# Patient Record
Sex: Female | Born: 1995 | Race: White | Hispanic: No | Marital: Single | Smoking: Current every day smoker
Health system: Southern US, Community
[De-identification: ages and names within clinical notes are randomized; demographics above are authoritative.]

## PROBLEM LIST (undated history)

## (undated) DIAGNOSIS — Z87442 Personal history of urinary calculi: Secondary | ICD-10-CM

## (undated) DIAGNOSIS — K589 Irritable bowel syndrome without diarrhea: Secondary | ICD-10-CM

## (undated) DIAGNOSIS — F329 Major depressive disorder, single episode, unspecified: Secondary | ICD-10-CM

## (undated) DIAGNOSIS — F32A Depression, unspecified: Secondary | ICD-10-CM

## (undated) DIAGNOSIS — D649 Anemia, unspecified: Secondary | ICD-10-CM

## (undated) DIAGNOSIS — N200 Calculus of kidney: Secondary | ICD-10-CM

## (undated) DIAGNOSIS — G43909 Migraine, unspecified, not intractable, without status migrainosus: Secondary | ICD-10-CM

## (undated) DIAGNOSIS — K219 Gastro-esophageal reflux disease without esophagitis: Secondary | ICD-10-CM

## (undated) HISTORY — PX: TONSILLECTOMY: SUR1361

## (undated) HISTORY — PX: WISDOM TOOTH EXTRACTION: SHX21

## (undated) HISTORY — DX: Depression, unspecified: F32.A

## (undated) HISTORY — DX: Gastro-esophageal reflux disease without esophagitis: K21.9

## (undated) HISTORY — DX: Irritable bowel syndrome, unspecified: K58.9

## (undated) HISTORY — DX: Major depressive disorder, single episode, unspecified: F32.9

---

## 2017-03-13 DIAGNOSIS — F329 Major depressive disorder, single episode, unspecified: Secondary | ICD-10-CM | POA: Insufficient documentation

## 2017-03-13 DIAGNOSIS — F419 Anxiety disorder, unspecified: Secondary | ICD-10-CM

## 2017-03-13 DIAGNOSIS — F32A Depression, unspecified: Secondary | ICD-10-CM | POA: Insufficient documentation

## 2017-03-13 DIAGNOSIS — D6851 Activated protein C resistance: Secondary | ICD-10-CM | POA: Insufficient documentation

## 2017-03-13 DIAGNOSIS — G479 Sleep disorder, unspecified: Secondary | ICD-10-CM | POA: Insufficient documentation

## 2017-03-13 DIAGNOSIS — G43009 Migraine without aura, not intractable, without status migrainosus: Secondary | ICD-10-CM | POA: Insufficient documentation

## 2017-07-13 ENCOUNTER — Other Ambulatory Visit: Payer: Self-pay

## 2017-07-13 DIAGNOSIS — R1011 Right upper quadrant pain: Secondary | ICD-10-CM | POA: Diagnosis present

## 2017-07-13 DIAGNOSIS — K8050 Calculus of bile duct without cholangitis or cholecystitis without obstruction: Secondary | ICD-10-CM | POA: Insufficient documentation

## 2017-07-13 LAB — COMPREHENSIVE METABOLIC PANEL
ALT: 19 U/L (ref 14–54)
AST: 26 U/L (ref 15–41)
Albumin: 4.1 g/dL (ref 3.5–5.0)
Alkaline Phosphatase: 142 U/L — ABNORMAL HIGH (ref 38–126)
Anion gap: 10 (ref 5–15)
BILIRUBIN TOTAL: 0.3 mg/dL (ref 0.3–1.2)
BUN: 9 mg/dL (ref 6–20)
CHLORIDE: 103 mmol/L (ref 101–111)
CO2: 25 mmol/L (ref 22–32)
CREATININE: 0.82 mg/dL (ref 0.44–1.00)
Calcium: 9.2 mg/dL (ref 8.9–10.3)
GFR calc non Af Amer: >60 mL/min (ref >60)
Glucose, Bld: 89 mg/dL (ref 65–99)
POTASSIUM: 3.5 mmol/L (ref 3.5–5.1)
Sodium: 138 mmol/L (ref 135–145)
TOTAL PROTEIN: 7.9 g/dL (ref 6.5–8.1)

## 2017-07-13 LAB — LIPASE, BLOOD: LIPASE: 36 U/L (ref 11–51)

## 2017-07-13 LAB — URINALYSIS, COMPLETE (UACMP) WITH MICROSCOPIC
Bilirubin Urine: NEGATIVE
GLUCOSE, UA: NEGATIVE mg/dL
KETONES UR: NEGATIVE mg/dL
NITRITE: NEGATIVE
PH: 5 (ref 5.0–8.0)
Protein, ur: NEGATIVE mg/dL
SPECIFIC GRAVITY, URINE: 1.018 (ref 1.005–1.030)

## 2017-07-13 LAB — CBC
HCT: 38.4 % (ref 35.0–47.0)
Hemoglobin: 12.5 g/dL (ref 12.0–16.0)
MCH: 26 pg (ref 26.0–34.0)
MCHC: 32.7 g/dL (ref 32.0–36.0)
MCV: 79.7 fL — ABNORMAL LOW (ref 80.0–100.0)
PLATELETS: 242 10*3/uL (ref 150–440)
RBC: 4.82 MIL/uL (ref 3.80–5.20)
RDW: 14.5 % (ref 11.5–14.5)
WBC: 8 10*3/uL (ref 3.6–11.0)

## 2017-07-13 LAB — POCT PREGNANCY, URINE: Preg Test, Ur: NEGATIVE

## 2017-07-13 NOTE — ED Triage Notes (Signed)
Patient with "knot in the middle upper quadrants/epigastric area that has been present x 2 days. Patient with nausea and vomiting x2 tonight at work. Patient denies possibility of pregnancy, denies abdominal surgeries in the past.

## 2017-07-14 ENCOUNTER — Emergency Department: Payer: BLUE CROSS/BLUE SHIELD

## 2017-07-14 ENCOUNTER — Emergency Department
Admission: EM | Admit: 2017-07-14 | Discharge: 2017-07-14 | Disposition: A | Discharge status: Home / Self Care | Payer: BLUE CROSS/BLUE SHIELD | Attending: Emergency Medicine | Admitting: Emergency Medicine

## 2017-07-14 ENCOUNTER — Encounter: Payer: Self-pay | Admitting: Emergency Medicine

## 2017-07-14 DIAGNOSIS — K8050 Calculus of bile duct without cholangitis or cholecystitis without obstruction: Secondary | ICD-10-CM

## 2017-07-14 DIAGNOSIS — R1011 Right upper quadrant pain: Secondary | ICD-10-CM

## 2017-07-14 DIAGNOSIS — R112 Nausea with vomiting, unspecified: Secondary | ICD-10-CM

## 2017-07-14 HISTORY — DX: Calculus of kidney: N20.0

## 2017-07-14 HISTORY — DX: Migraine, unspecified, not intractable, without status migrainosus: G43.909

## 2017-07-14 MED ORDER — TRAMADOL HCL 50 MG PO TABS: ORAL_TABLET | ORAL | 0 refills | Status: DC

## 2017-07-14 MED ORDER — DICYCLOMINE HCL 10 MG PO CAPS: 10.0000 mg | ORAL_CAPSULE | Freq: Three times a day (TID) | ORAL | 0 refills | Status: DC | PRN

## 2017-07-14 MED ORDER — DICYCLOMINE HCL 10 MG/ML IM SOLN
20.0000 mg | Freq: Once | INTRAMUSCULAR | Status: AC
  Administered 2017-07-14: 20 mg via INTRAMUSCULAR
  Filled 2017-07-14: qty 2

## 2017-07-14 MED ORDER — ACETAMINOPHEN 500 MG PO TABS
1000.0000 mg | ORAL_TABLET | Freq: Once | ORAL | Status: AC
  Administered 2017-07-14: 1000 mg via ORAL
  Filled 2017-07-14: qty 2

## 2017-07-14 MED ORDER — ONDANSETRON 4 MG PO TBDP: ORAL_TABLET | ORAL | 0 refills | Status: DC

## 2017-07-14 NOTE — ED Notes (Signed)
Pt updated on ultrasound process and result turn around time. Pt verbalizes understanding.

## 2017-07-14 NOTE — ED Notes (Signed)
Report to megan, rn.  

## 2017-07-14 NOTE — ED Notes (Signed)
Pt lying in bed in no acute distress, registration at bedside.

## 2017-07-14 NOTE — Discharge Instructions (Signed)
You have been seen in the Emergency Department (ED) for abdominal pain.  Your evaluation suggests that your pain is caused by gallstones.  Fortunately you do not need immediate surgery at this time, but it is important that you follow up with a surgeon as an outpatient; typically surgical removal of the gallbladder is the only thing that will definitively fix your issue.  Read through the included information about a bland diet, and use any prescribed medications as instructed.  Avoid smoking and alcohol use. ° °Please follow up as instructed above regarding today?s emergent visit and the symptoms that are bothering you. ° °Take Tramadol as prescribed. Do not drink alcohol, drive or participate in any other potentially dangerous activities while taking this medication as it may make you sleepy. Do not take this medication with any other sedating medications, either prescription or over-the-counter. If you were prescribed Percocet or Vicodin, do not take these with acetaminophen (Tylenol) as it is already contained within these medications. °  °This medication is an opiate (or narcotic) pain medication and can be habit forming.  Use it as little as possible to achieve adequate pain control.  Do not use or use it with extreme caution if you have a history of opiate abuse or dependence.  If you are on a pain contract with your primary care doctor or a pain specialist, be sure to let them know you were prescribed this medication today from the [facility].  This medication is intended for your use only - do not give any to anyone else and keep it in a secure place where nobody else, especially children, have access to it.  It will also cause or worsen constipation, so you may want to consider taking an over-the-counter stool softener while you are taking this medication. ° °Return to the ED if your abdominal pain worsens or fails to improve, you develop bloody vomiting, bloody diarrhea, you  are unable to tolerate fluids due to vomiting, fever greater than 101, or other symptoms that concern you. ° °

## 2017-07-14 NOTE — ED Provider Notes (Signed)
[facility] Emergency Department Provider Note  ____________________________________________   First MD Initiated Contact with Patient 07/14/17 0400     (approximate)  I have reviewed the triage vital signs and the nursing notes.   HISTORY  Chief Complaint Abdominal Pain    HPI Brittney Boyle is a 22 y.o. female with chronic medical history as listed below who presents for evaluation of 2 to 3 days of persistent upper abdominal pain.  She says it is worse at night and after she eats.  It is accompanied with multiple episodes of nausea and vomiting.  The pain never completely goes away since it started acutely about 3 days ago.  Nothing particular makes it better.  She describes it as a dull aching pain is at the very bottom of her chest or the very top of her abdomen as well as in the right upper quadrant.  She has no lower abdominal pain.  Her last menstrual cycle was about 2 weeks ago and she has had no recent vaginal bleeding and no dysuria.  She denies fever/chills, chest pain, shortness of breath, and diarrhea.  She states that multiple members of her family have history of gallbladder disease.  Sometimes she has acid reflux.  Past Medical History:  Diagnosis Date  . Kidney stone   . Migraines     There are no active problems to display for this patient.   Past Surgical History:  Procedure Laterality Date  . TONSILLECTOMY      Prior to Admission medications   Medication Sig Start Date End Date Taking? Authorizing Provider  dicyclomine (BENTYL) 10 MG capsule Take 1 capsule (10 mg total) by mouth 3 (three) times daily as needed for up to 14 days for spasms. or abdominal pain 07/14/17 07/28/17  Loleta Rose, MD  ondansetron (ZOFRAN ODT) 4 MG disintegrating tablet Allow 1-2 tablets to dissolve in your mouth every 8 hours as needed for nausea/vomiting 07/14/17   Loleta Rose, MD  traMADol Janean Sark) 50 MG tablet Take 1-2 tablets by mouth every 6 hours as  needed for moderate to severe pain 07/14/17   Loleta Rose, MD    Allergies Sulfa antibiotics  History reviewed. No pertinent family history.  Social History Social History   Tobacco Use  . Smoking status: Never Smoker  . Smokeless tobacco: Never Used  Substance Use Topics  . Alcohol use: Never    Frequency: Never  . Drug use: Never    Review of Systems Constitutional: No fever/chills Eyes: No visual changes. ENT: No sore throat. Cardiovascular: Denies chest pain. Respiratory: Denies shortness of breath. Gastrointestinal: Abdominal pain with nausea and vomiting as described above Genitourinary: Negative for dysuria. Musculoskeletal: Negative for neck pain.  Negative for back pain. Integumentary: Negative for rash. Neurological: Negative for headaches, focal weakness or numbness.   ____________________________________________   PHYSICAL EXAM:  VITAL SIGNS: ED Triage Vitals  Enc Vitals Group     BP 07/13/17 2314 124/82     Pulse Rate 07/13/17 2314 97     Resp 07/13/17 2314 18     Temp 07/13/17 2314 98.6 F (37 C)     Temp Source 07/13/17 2314 Oral     SpO2 --      Weight 07/13/17 2315 90.7 kg (200 lb)     Height 07/13/17 2315 1.626 m ( )     Head Circumference --      Peak Flow --      Pain Score 07/13/17 2315 8  Pain Loc --      Pain Edu? --      Excl. in GC? --     Constitutional: Alert and oriented. Well appearing and in no acute distress. Eyes: Conjunctivae are normal.  Head: Atraumatic. Nose: No congestion/rhinnorhea. Mouth/Throat: Mucous membranes are moist. Neck: No stridor.  No meningeal signs.   Cardiovascular: Normal rate, regular rhythm. Good peripheral circulation. Grossly normal heart sounds. Respiratory: Normal respiratory effort.  No retractions. Lungs CTAB. Gastrointestinal: Obese.  Soft with moderate to severe tenderness to palpation of the epigastrium and right upper quadrant with positive Murphy sign.  No lower abdominal  tenderness to palpation GU: Deferred given that she is having upper abdominal pain and no lower abdominal pain or pelvic pain Musculoskeletal: No lower extremity tenderness nor edema. No gross deformities of extremities. Neurologic:  Normal speech and language. No gross focal neurologic deficits are appreciated.  Skin:  Skin is warm, dry and intact. No rash noted. Psychiatric: Mood and affect are normal. Speech and behavior are normal.  ____________________________________________   LABS (all labs ordered are listed, but only abnormal results are displayed)  Labs Reviewed  COMPREHENSIVE METABOLIC PANEL - Abnormal; Notable for the following components:      Result Value   Alkaline Phosphatase 142 (*)    All other components within normal limits  CBC - Abnormal; Notable for the following components:   MCV 79.7 (*)    All other components within normal limits  URINALYSIS, COMPLETE (UACMP) WITH MICROSCOPIC - Abnormal; Notable for the following components:   Color, Urine YELLOW (*)    APPearance HAZY (*)    Hgb urine dipstick MODERATE (*)    Leukocytes, UA SMALL (*)    RBC / HPF >50 (*)    Bacteria, UA RARE (*)    All other components within normal limits  LIPASE, BLOOD  POC URINE PREG, ED  POCT PREGNANCY, URINE   ____________________________________________  EKG  None - EKG not ordered by ED physician ____________________________________________  RADIOLOGY   ED MD interpretation:  single mobile gallstone without evidence of cholecystitis  Official radiology report(s): US Abdomen Limited Ruq  Result Date: 07/14/2017 CLINICAL DATA:  Epigastric pain with nausea and vomiting EXAM: ULTRASOUND ABDOMEN LIMITED RIGHT UPPER QUADRANT COMPARISON:  None. FINDINGS: Gallbladder: There is a single mobile shadowing stone measuring 5.5 mm. No wall thickening or pericholecystic fluid. Negative sonographic Eulah Pont sign reported by the sonographer. Common bile duct: Diameter: 2 mm Liver: No  focal lesion identified. Within normal limits in parenchymal echogenicity. Portal vein is patent on color Doppler imaging with normal direction of blood flow towards the liver. IMPRESSION: Single mobile gallstone without other evidence of acute cholecystitis. Electronically Signed   By: Deatra Robinson M.D.   On: 07/14/2017 05:37    ____________________________________________   PROCEDURES  Critical Care performed: No   Procedure(s) performed:   Procedures   ____________________________________________   INITIAL IMPRESSION / ASSESSMENT AND PLAN / ED COURSE  As part of my medical decision making, I reviewed the following data within the electronic MEDICAL RECORD NUMBER Nursing notes reviewed and incorporated and Labs reviewed     Differential diagnosis includes, but is not limited to, biliary disease (biliary colic, acute cholecystitis, cholangitis, choledocholithiasis, etc), intrathoracic causes for epigastric abdominal pain including ACS, gastritis, duodenitis, pancreatitis, small bowel or large bowel obstruction, abdominal aortic aneurysm, hernia, and ulcer(s).  Based on the patient's demographics and history of present illness, I think most likely she has gallbladder disease although acid reflux is  the next item on my differential.  Lab work is notable only for an elevated alkaline phosphatase.  Lipase is within normal limits and there is no AST or ALT elevation.  She does have some blood in her urine with greater than 50 reds and 11-20 white cells but it is nitrite negative and she is having no dysuria.  I will send a culture but not treat empirically.  Obtaining ultrasound.  Patient agrees with plan.   Clinical Course as of Jul 14 712  Sat Jul 14, 2017  1610 Radiologist identified one mobile stone in the gallbladder but no signs of cholecystitis.  I will discuss with the patient and give my usual customary outpatient management recommendations.  US ABDOMEN LIMITED RUQ [CF]  [phone]  Patient is still having mild to moderate discomfort at this time so I will treat with Bentyl 20 mg intramuscular.  She does not want to have anything that will make her sleepy.  I am providing a prescription for Bentyl, tramadol, and Zofran.  I gave my usual and customary return precautions and she understands and agrees with the plan for outpatient follow-up.  I also gave her the gallbladder eating plan.   [CF]    Clinical Course User Index [CF] Loleta Rose, MD    ____________________________________________  FINAL CLINICAL IMPRESSION(S) / ED DIAGNOSES  Final diagnoses:  RUQ pain  Non-intractable vomiting with nausea, unspecified vomiting type  Biliary colic     MEDICATIONS GIVEN DURING THIS VISIT:  Medications  dicyclomine (BENTYL) injection 20 mg (has no administration in time range)  acetaminophen (TYLENOL) tablet 1,000 mg (1,000 mg Oral Given 07/14/17 0636)     ED Discharge Orders        Ordered    dicyclomine (BENTYL) 10 MG capsule  3 times daily PRN     07/14/17 0636    traMADol (ULTRAM) 50 MG tablet     07/14/17 0636    ondansetron (ZOFRAN ODT) 4 MG disintegrating tablet     07/14/17 0636       Note:  This document was prepared using Dragon voice recognition software and may include unintentional dictation errors.    Loleta Rose, MD 07/14/17 [id number]

## 2017-07-14 NOTE — ED Notes (Signed)
Patient transported to Ultrasound 

## 2017-07-14 NOTE — ED Notes (Signed)
NAD noted at time of D/C. Pt denies questions or concerns. Pt ambulatory to the lobby at this time.  

## 2017-07-30 ENCOUNTER — Other Ambulatory Visit: Payer: Self-pay

## 2017-07-31 ENCOUNTER — Ambulatory Visit (INDEPENDENT_AMBULATORY_CARE_PROVIDER_SITE_OTHER): Payer: BLUE CROSS/BLUE SHIELD | Admitting: Surgery

## 2017-07-31 ENCOUNTER — Encounter: Payer: Self-pay | Admitting: Surgery

## 2017-07-31 VITALS — BP 121/71 | HR 79 | Temp 98.1°F | Ht 64.0 in | Wt 206.0 lb

## 2017-07-31 DIAGNOSIS — K8020 Calculus of gallbladder without cholecystitis without obstruction: Secondary | ICD-10-CM | POA: Diagnosis not present

## 2017-07-31 NOTE — H&P (View-Only) (Signed)
Surgical Clinic History and Physical  Referring provider:  [facility] [address] [place], [place] [address]  HISTORY OF PRESENT ILLNESS (HPI):  22 y.o. female presents for evaluation of abdominal pain. Patient reports she's previously experienced more mild post-prandial RUQ > epigastric abdominal pain after eating fatty foods such as meats, cheese, dairy, and fried foods in particular x several years. Two weeks ago, she experienced her first episode of severe RUQ abdominal pain for which she presented to [facility] ED, since which she has tried to avoid most fatty foods. She also describes occasional nausea without emesis, continues to pass flatus and BM WNL without abdominal distention and likewise denies fever/chills, CP, or SOB. She presents today, requesting cholecystectomy to avoid experiencing recurrence of her pain and fear of eating "the wrong foods". She also adds most people in her family have underwent cholecystectomy for similar.  PAST MEDICAL HISTORY (PMH):  Past Medical History:  Diagnosis Date  . Depression   . GERD (gastroesophageal reflux disease)   . IBS (irritable bowel syndrome)   . Kidney stone   . Migraines      PAST SURGICAL HISTORY (PSH):  Past Surgical History:  Procedure Laterality Date  . TONSILLECTOMY    . WISDOM TOOTH EXTRACTION       MEDICATIONS:  Prior to Admission medications   Medication Sig Start Date End Date Taking? Authorizing Provider  Multiple Vitamin (MULTI-VITAMINS) TABS Take by mouth.   Yes [provider]  Norgestimate-Ethinyl Estradiol Triphasic 0.18/0.215/0.25 MG-35 MCG tablet Take by mouth. 03/13/17 03/13/18 Yes [provider]  omeprazole (PRILOSEC) 20 MG capsule Take 20 mg by mouth daily.   Yes [provider]  ondansetron (ZOFRAN ODT) 4 MG disintegrating tablet Allow 1-2 tablets to dissolve in your mouth every 8 hours as needed for nausea/vomiting 07/14/17  Yes Loleta Rose, MD  Probiotic Product  (PROBIOTIC-10) CAPS Take by mouth.   Yes [provider]  tamsulosin (FLOMAX) 0.4 MG CAPS capsule Take by mouth. 03/13/17  Yes [provider]  traMADol (ULTRAM) 50 MG tablet Take 1-2 tablets by mouth every 6 hours as needed for moderate to severe pain 07/14/17  Yes Loleta Rose, MD  venlafaxine XR (EFFEXOR-XR) 150 MG 24 hr capsule Take by mouth. 03/14/17 03/14/18 Yes [provider]  venlafaxine XR (EFFEXOR-XR) 75 MG 24 hr capsule Take by mouth. 11/17/16  Yes [provider]  dicyclomine (BENTYL) 10 MG capsule Take 1 capsule (10 mg total) by mouth 3 (three) times daily as needed for up to 14 days for spasms. or abdominal pain 07/14/17 07/28/17  Loleta Rose, MD     ALLERGIES:  Allergies  Allergen Reactions  . Sulfa Antibiotics Anaphylaxis and Swelling  . Shellfish Allergy Rash     SOCIAL HISTORY:  Social History   Socioeconomic History  . Marital status: Single    Spouse name: Not on file  . Number of children: Not on file  . Years of education: Not on file  . Highest education level: Not on file  Occupational History  . Not on file  Social Needs  . Financial resource strain: Not on file  . Food insecurity:    Worry: Not on file    Inability: Not on file  . Transportation needs:    Medical: Not on file    Non-medical: Not on file  Tobacco Use  . Smoking status: Never Smoker  . Smokeless tobacco: Never Used  Substance and Sexual Activity  . Alcohol use: Never  Frequency: Never  . Drug use: Never  . Sexual activity: Not Currently  Lifestyle  . Physical activity:    Days per week: Not on file    Minutes per session: Not on file  . Stress: Not on file  Relationships  . Social connections:    Talks on phone: Not on file    Gets together: Not on file    Attends religious service: Not on file    Active member of club or organization: Not on file    Attends meetings of clubs or organizations: Not on file    Relationship status: Not on  file  . Intimate partner violence:    Fear of current or ex partner: Not on file    Emotionally abused: Not on file    Physically abused: Not on file    Forced sexual activity: Not on file  Other Topics Concern  . Not on file  Social History Narrative  . Not on file    The patient currently resides (home / rehab facility / nursing home): Home The patient normally is (ambulatory / bedbound): Ambulatory  FAMILY HISTORY:  Family History  Problem Relation Age of Onset  . Endometrial cancer Maternal Grandmother   . Thyroid cancer Paternal Grandmother   . Breast cancer Other     Otherwise negative/non-contributory.  REVIEW OF SYSTEMS:  Constitutional: denies any other weight loss, fever, chills, or sweats  Eyes: denies any other vision changes, history of eye injury  ENT: denies sore throat, hearing problems  Respiratory: denies shortness of breath, wheezing  Cardiovascular: denies chest pain, palpitations  Gastrointestinal: abdominal pain, N/V, and bowel function as per HPI Musculoskeletal: denies any other joint pains or cramps  Skin: Denies any other rashes or skin discolorations Neurological: denies any other headache, dizziness, weakness  Psychiatric: Denies any other depression, anxiety   All other review of systems were otherwise negative   VITAL SIGNS:  BP 121/71   Pulse 79   Temp 98.1 F (36.7 C) (Oral)   Ht 5\' 4"  (1.626 m)   Wt 206 lb (93.4 kg)   BMI 35.36 kg/m   PHYSICAL EXAM:  Constitutional:  -- Obese body habitus  -- Awake, alert, and oriented x3  Eyes:  -- Pupils equally round and reactive to light  -- No scleral icterus  Ear, nose, throat:  -- No jugular venous distension -- No nasal drainage, bleeding Pulmonary:  -- No crackles  -- Equal breath sounds bilaterally -- Breathing non-labored at rest Cardiovascular:  -- S1, S2 present  -- No pericardial rubs  Gastrointestinal:  -- Abdomen soft and non-distended with mild-/moderate- RUQ abdominal  pain, no guarding/rebound  -- No abdominal masses appreciated, pulsatile or otherwise  Musculoskeletal and Integumentary:  -- Wounds or skin discoloration: None appreciated -- Extremities: B/L UE and LE FROM, hands and feet warm  Neurologic:  -- Motor function: Intact and symmetric -- Sensation: Intact and symmetric  Labs:  CBC Latest Ref Rng & Units 07/13/2017  WBC 3.6 - 11.0 K/uL 8.0  Hemoglobin 12.0 - 16.0 g/dL 16.112.5  Hematocrit 09.635.0 - 47.0 % 38.4  Platelets 150 - 440 K/uL 242   CMP Latest Ref Rng & Units 07/13/2017  Glucose 65 - 99 mg/dL 89  BUN 6 - 20 mg/dL 9  Creatinine 0.450.44 - 4.091.00 mg/dL 8.110.82  Sodium 914135 - 782145 mmol/L 138  Potassium 3.5 - 5.1 mmol/L 3.5  Chloride 101 - 111 mmol/L 103  CO2 22 - 32 mmol/L 25  Calcium  8.9 - 10.3 mg/dL 9.2  Total Protein 6.5 - 8.1 g/dL 7.9  Total Bilirubin 0.3 - 1.2 mg/dL 0.3  Alkaline Phos 38 - 126 U/L 142(H)  AST 15 - 41 U/L 26  ALT 14 - 54 U/L 19    Imaging studies:  Limited RUQ Abdominal Ultrasound (07/14/2017) Cholelithiasis without sonographic evidence of cholecystitis. Common bile duct diameter measures 2 mm.   Assessment/Plan: (ICD-10's: K78.20) 22 y.o. female with symptomatic cholelithiasis, complicated by pertinent comorbidities including morbid obesity (BMI >35), IBS, GERD, nephrolithiasis, and migraine headaches.              - avoid/minimize foods with higher fat content (meats, cheeses/dairy, and fried)             - prefer low-fat vegetables, whole grains (wheat bread, ceareals, etc), and fruits until cholecystectomy              - all risks, benefits, and alternatives to cholecystectomy were discussed with the patient, all of his questions were answered to his expressed satisfaction, patient expresses she wishes to proceed, and informed consent was obtained.             - will plan for laparoscopic cholecystectomy pending OR availability             - anticipate return to clinic 2 weeks after above planned surgery              - instructed to call if any questions or concerns  All of the above recommendations were discussed with the patient and patient's family, and all of patient's and family's questions were answered to their expressed satisfaction.  Thank you for the opportunity to participate in this patient's care.  -- Scherrie Gerlach Earlene Plater, MD, RPVI [place]: [facility] - Partnering for exceptional care. Office: [phone]

## 2017-07-31 NOTE — Patient Instructions (Signed)
You have requested to have your Gallbladder removed. We will arrange this to be done on 08/15/2017 at [facility].   You will be off from work for approximately 1-2 weeks depending on your recovery.   Please avoid greasy and fried foods if at all possible prior to your scheduled surgery to decrease symptoms until then.  Please see the [facility] pre-care form you have been given today.  If you have any questions or concerns please call our office.

## 2017-07-31 NOTE — Progress Notes (Signed)
Surgical Clinic History and Physical  Referring provider:  Care, [facility] [address] [place], [place] [address]  HISTORY OF PRESENT ILLNESS (HPI):  22 y.o. female presents for evaluation of abdominal pain. Patient reports she's previously experienced more mild post-prandial RUQ > epigastric abdominal pain after eating fatty foods such as meats, cheese, dairy, and fried foods in particular x several years. Two weeks ago, she experienced her first episode of severe RUQ abdominal pain for which she presented to [facility] ED, since which she has tried to avoid most fatty foods. She also describes occasional nausea without emesis, continues to pass flatus and BM WNL without abdominal distention and likewise denies fever/chills, CP, or SOB. She presents today, requesting cholecystectomy to avoid experiencing recurrence of her pain and fear of eating "the wrong foods". She also adds most people in her family have underwent cholecystectomy for similar.  PAST MEDICAL HISTORY (PMH):  Past Medical History:  Diagnosis Date  . Depression   . GERD (gastroesophageal reflux disease)   . IBS (irritable bowel syndrome)   . Kidney stone   . Migraines      PAST SURGICAL HISTORY (PSH):  Past Surgical History:  Procedure Laterality Date  . TONSILLECTOMY    . WISDOM TOOTH EXTRACTION       MEDICATIONS:  Prior to Admission medications   Medication Sig Start Date End Date Taking? Authorizing Provider  Multiple Vitamin (MULTI-VITAMINS) TABS Take by mouth.   Yes [provider]  Norgestimate-Ethinyl Estradiol Triphasic 0.18/0.215/0.25 MG-35 MCG tablet Take by mouth. 03/13/17 03/13/18 Yes [provider]  omeprazole (PRILOSEC) 20 MG capsule Take 20 mg by mouth daily.   Yes [provider]  ondansetron (ZOFRAN ODT) 4 MG disintegrating tablet Allow 1-2 tablets to dissolve in your mouth every 8 hours as needed for nausea/vomiting 07/14/17  Yes Loleta Rose, MD  Probiotic Product  (PROBIOTIC-10) CAPS Take by mouth.   Yes [provider]  tamsulosin (FLOMAX) 0.4 MG CAPS capsule Take by mouth. 03/13/17  Yes [provider]  traMADol (ULTRAM) 50 MG tablet Take 1-2 tablets by mouth every 6 hours as needed for moderate to severe pain 07/14/17  Yes Loleta Rose, MD  venlafaxine XR (EFFEXOR-XR) 150 MG 24 hr capsule Take by mouth. 03/14/17 03/14/18 Yes [provider]  venlafaxine XR (EFFEXOR-XR) 75 MG 24 hr capsule Take by mouth. 11/17/16  Yes [provider]  dicyclomine (BENTYL) 10 MG capsule Take 1 capsule (10 mg total) by mouth 3 (three) times daily as needed for up to 14 days for spasms. or abdominal pain 07/14/17 07/28/17  Loleta Rose, MD     ALLERGIES:  Allergies  Allergen Reactions  . Sulfa Antibiotics Anaphylaxis and Swelling  . Shellfish Allergy Rash     SOCIAL HISTORY:  Social History   Socioeconomic History  . Marital status: Single    Spouse name: Not on file  . Number of children: Not on file  . Years of education: Not on file  . Highest education level: Not on file  Occupational History  . Not on file  Social Needs  . Financial resource strain: Not on file  . Food insecurity:    Worry: Not on file    Inability: Not on file  . Transportation needs:    Medical: Not on file    Non-medical: Not on file  Tobacco Use  . Smoking status: Never Smoker  . Smokeless tobacco: Never Used  Substance and Sexual Activity  . Alcohol use: Never  Frequency: Never  . Drug use: Never  . Sexual activity: Not Currently  Lifestyle  . Physical activity:    Days per week: Not on file    Minutes per session: Not on file  . Stress: Not on file  Relationships  . Social connections:    Talks on phone: Not on file    Gets together: Not on file    Attends religious service: Not on file    Active member of club or organization: Not on file    Attends meetings of clubs or organizations: Not on file    Relationship status: Not on  file  . Intimate partner violence:    Fear of current or ex partner: Not on file    Emotionally abused: Not on file    Physically abused: Not on file    Forced sexual activity: Not on file  Other Topics Concern  . Not on file  Social History Narrative  . Not on file    The patient currently resides (home / rehab facility / nursing home): Home The patient normally is (ambulatory / bedbound): Ambulatory  FAMILY HISTORY:  Family History  Problem Relation Age of Onset  . Endometrial cancer Maternal Grandmother   . Thyroid cancer Paternal Grandmother   . Breast cancer Other     Otherwise negative/non-contributory.  REVIEW OF SYSTEMS:  Constitutional: denies any other weight loss, fever, chills, or sweats  Eyes: denies any other vision changes, history of eye injury  ENT: denies sore throat, hearing problems  Respiratory: denies shortness of breath, wheezing  Cardiovascular: denies chest pain, palpitations  Gastrointestinal: abdominal pain, N/V, and bowel function as per HPI Musculoskeletal: denies any other joint pains or cramps  Skin: Denies any other rashes or skin discolorations Neurological: denies any other headache, dizziness, weakness  Psychiatric: Denies any other depression, anxiety   All other review of systems were otherwise negative   VITAL SIGNS:  BP 121/71   Pulse 79   Temp 98.1 F (36.7 C) (Oral)   Ht 5\' 4"  (1.626 m)   Wt 206 lb (93.4 kg)   BMI 35.36 kg/m   PHYSICAL EXAM:  Constitutional:  -- Obese body habitus  -- Awake, alert, and oriented x3  Eyes:  -- Pupils equally round and reactive to light  -- No scleral icterus  Ear, nose, throat:  -- No jugular venous distension -- No nasal drainage, bleeding Pulmonary:  -- No crackles  -- Equal breath sounds bilaterally -- Breathing non-labored at rest Cardiovascular:  -- S1, S2 present  -- No pericardial rubs  Gastrointestinal:  -- Abdomen soft and non-distended with mild-/moderate- RUQ abdominal  pain, no guarding/rebound  -- No abdominal masses appreciated, pulsatile or otherwise  Musculoskeletal and Integumentary:  -- Wounds or skin discoloration: None appreciated -- Extremities: B/L UE and LE FROM, hands and feet warm  Neurologic:  -- Motor function: Intact and symmetric -- Sensation: Intact and symmetric  Labs:  CBC Latest Ref Rng & Units 07/13/2017  WBC 3.6 - 11.0 K/uL 8.0  Hemoglobin 12.0 - 16.0 g/dL 16.112.5  Hematocrit 09.635.0 - 47.0 % 38.4  Platelets 150 - 440 K/uL 242   CMP Latest Ref Rng & Units 07/13/2017  Glucose 65 - 99 mg/dL 89  BUN 6 - 20 mg/dL 9  Creatinine 0.450.44 - 4.091.00 mg/dL 8.110.82  Sodium 914135 - 782145 mmol/L 138  Potassium 3.5 - 5.1 mmol/L 3.5  Chloride 101 - 111 mmol/L 103  CO2 22 - 32 mmol/L 25  Calcium  8.9 - 10.3 mg/dL 9.2  Total Protein 6.5 - 8.1 g/dL 7.9  Total Bilirubin 0.3 - 1.2 mg/dL 0.3  Alkaline Phos 38 - 126 U/L 142(H)  AST 15 - 41 U/L 26  ALT 14 - 54 U/L 19    Imaging studies:  Limited RUQ Abdominal Ultrasound (07/14/2017) Cholelithiasis without sonographic evidence of cholecystitis. Common bile duct diameter measures 2 mm.   Assessment/Plan: (ICD-10's: K78.20) 22 y.o. female with symptomatic cholelithiasis, complicated by pertinent comorbidities including morbid obesity (BMI >35), IBS, GERD, nephrolithiasis, and migraine headaches.              - avoid/minimize foods with higher fat content (meats, cheeses/dairy, and fried)             - prefer low-fat vegetables, whole grains (wheat bread, ceareals, etc), and fruits until cholecystectomy              - all risks, benefits, and alternatives to cholecystectomy were discussed with the patient, all of his questions were answered to his expressed satisfaction, patient expresses she wishes to proceed, and informed consent was obtained.             - will plan for laparoscopic cholecystectomy pending OR availability             - anticipate return to clinic 2 weeks after above planned surgery              - instructed to call if any questions or concerns  All of the above recommendations were discussed with the patient and patient's family, and all of patient's and family's questions were answered to their expressed satisfaction.  Thank you for the opportunity to participate in this patient's care.  -- Scherrie Gerlach Earlene Plater, MD, RPVI [place]: [facility] General Surgery - Partnering for exceptional care. Office: [phone]

## 2017-08-01 ENCOUNTER — Encounter: Payer: Self-pay | Admitting: Surgery

## 2017-08-02 ENCOUNTER — Telehealth: Payer: Self-pay | Admitting: Surgery

## 2017-08-02 NOTE — Telephone Encounter (Signed)
Pt advised of pre op date/time and sx date. Sx: 08/15/17 with Dr Davis-laparoscopic cholecystectomy.  Pre op: 08/08/17 between 1-5:00pm--phone interview.   Patient made aware to call [phone], between 1-3:00pm the day before surgery, to find out what time to arrive.

## 2017-08-08 ENCOUNTER — Encounter
Admission: RE | Admit: 2017-08-08 | Discharge: 2017-08-08 | Disposition: A | Discharge status: Home / Self Care | Payer: BLUE CROSS/BLUE SHIELD | Source: Ambulatory Visit | Attending: Surgery | Admitting: Surgery

## 2017-08-08 ENCOUNTER — Other Ambulatory Visit: Payer: Self-pay

## 2017-08-08 HISTORY — DX: Anemia, unspecified: D64.9

## 2017-08-08 HISTORY — DX: Personal history of urinary calculi: Z87.442

## 2017-08-08 NOTE — Patient Instructions (Signed)
Your procedure is scheduled on: 08-15-17 Report to Same Day Surgery 2nd floor medical mall [facility](Medical Mall Entrance-take elevator on left to 2nd floor.  Check in with surgery information desk.) To find out your arrival time please call [phone] between 1PM - 3PM on 08-14-17  Remember: Instructions that are not followed completely may result in serious medical risk, up to and including death, or upon the discretion of your surgeon and anesthesiologist your surgery may need to be rescheduled.    _x___ 1. Do not eat food after midnight the night before your procedure. NO GUM OR CANDY AFTER MIDNIGHT.  You may drink clear liquids up to 2 hours before you are scheduled to arrive at the hospital for your procedure.  Do not drink clear liquids within 2 hours of your scheduled arrival to the hospital.  Clear liquids include  --Water or Apple juice without pulp  --Clear carbohydrate beverage such as ClearFast or Gatorade  --Black Coffee or Clear Tea (No milk, no creamers, do not add anything to  the coffee or Tea     __x__ 2. No Alcohol for 24 hours before or after surgery.   __x__3. No Smoking or e-cigarettes for 24 prior to surgery.  Do not use any chewable tobacco products for at least 6 hour prior to surgery   ____  4. Bring all medications with you on the day of surgery if instructed.    __x__ 5. Notify your doctor if there is any change in your medical condition     (cold, fever, infections).    x___6. On the morning of surgery brush your teeth with toothpaste and water.  You may rinse your mouth with mouth wash if you wish.  Do not swallow any toothpaste or mouthwash.   Do not wear jewelry, make-up, hairpins, clips or nail polish.  Do not wear lotions, powders, or perfumes. You may wear deodorant.  Do not shave 48 hours prior to surgery. Men may shave face and neck.  Do not bring valuables to the hospital.    [facility] any belongings or valuables.     Contacts, dentures or bridgework may not be worn into surgery.  Leave your suitcase in the car. After surgery it may be brought to your room.  For patients admitted to the hospital, discharge time is determined by your treatment team.  _  Patients discharged the day of surgery will not be allowed to drive home.  You will need someone to drive you home and stay with you the night of your procedure.    Please read over the following fact sheets that you were given:   [facility] Preparing for [facility] and or MRSA Information   _x___ TAKE THE FOLLOWING MEDICATION THE MORNING OF SURGERY WITH A SMALL SIP OF WATER. These include:  1. FLOMAX  2. EFFEXOR   3. BCP (PT REQUESTING TO TAKE THIS SINCE SHE TAKES IT AT THE SAME TIME DAILY)  4.  5.  6.  ____Fleets enema or Magnesium Citrate as directed.   ____ Use CHG Soap or sage wipes as directed on instruction sheet   ____ Use inhalers on the day of surgery and bring to hospital day of surgery  ____ Stop Metformin and Janumet 2 days prior to surgery.    ____ Take 1/2 of usual insulin dose the night before surgery and none on the morning surgery.   ____ Follow recommendations from Cardiologist, Pulmonologist or PCP regarding stopping Aspirin, Coumadin, Plavix ,  Eliquis, Effient, or Pradaxa, and Pletal.  X____Stop Anti-inflammatories such as Advil, Aleve, Ibuprofen, Motrin, Naproxen, Naprosyn, Goodies powders, EXCEDRIN MIGRAINE or aspirin products NOW-OK to take Tylenol    _X___ Stop supplements until after surgery-STOP MELATONIN NOW-MAY RESUME AFTER SURGERY   ____ Bring C-Pap to the hospital.

## 2017-08-15 ENCOUNTER — Ambulatory Visit: Payer: BLUE CROSS/BLUE SHIELD | Admitting: Registered Nurse

## 2017-08-15 ENCOUNTER — Ambulatory Visit
Admission: RE | Admit: 2017-08-15 | Discharge: 2017-08-15 | Disposition: A | Discharge status: Home / Self Care | Payer: BLUE CROSS/BLUE SHIELD | Source: Ambulatory Visit | Attending: Surgery | Admitting: Surgery

## 2017-08-15 ENCOUNTER — Encounter
Admission: RE | Disposition: A | Discharge status: Home / Self Care | Payer: Self-pay | Source: Ambulatory Visit | Attending: Surgery

## 2017-08-15 DIAGNOSIS — Z882 Allergy status to sulfonamides status: Secondary | ICD-10-CM | POA: Diagnosis not present

## 2017-08-15 DIAGNOSIS — Z91013 Allergy to seafood: Secondary | ICD-10-CM | POA: Diagnosis not present

## 2017-08-15 DIAGNOSIS — K589 Irritable bowel syndrome without diarrhea: Secondary | ICD-10-CM | POA: Diagnosis not present

## 2017-08-15 DIAGNOSIS — F329 Major depressive disorder, single episode, unspecified: Secondary | ICD-10-CM | POA: Insufficient documentation

## 2017-08-15 DIAGNOSIS — K219 Gastro-esophageal reflux disease without esophagitis: Secondary | ICD-10-CM | POA: Diagnosis not present

## 2017-08-15 DIAGNOSIS — K8010 Calculus of gallbladder with chronic cholecystitis without obstruction: Secondary | ICD-10-CM | POA: Insufficient documentation

## 2017-08-15 DIAGNOSIS — K8020 Calculus of gallbladder without cholecystitis without obstruction: Secondary | ICD-10-CM

## 2017-08-15 DIAGNOSIS — Z79899 Other long term (current) drug therapy: Secondary | ICD-10-CM | POA: Diagnosis not present

## 2017-08-15 DIAGNOSIS — F419 Anxiety disorder, unspecified: Secondary | ICD-10-CM | POA: Insufficient documentation

## 2017-08-15 HISTORY — PX: CHOLECYSTECTOMY: SHX55

## 2017-08-15 LAB — POCT PREGNANCY, URINE: Preg Test, Ur: NEGATIVE

## 2017-08-15 SURGERY — LAPAROSCOPIC CHOLECYSTECTOMY
Anesthesia: General

## 2017-08-15 MED ORDER — ONDANSETRON HCL 4 MG/2ML IJ SOLN: 4.0000 mg | Freq: Once | INTRAMUSCULAR | Status: DC | PRN

## 2017-08-15 MED ORDER — CHLORHEXIDINE GLUCONATE CLOTH 2 % EX PADS
6.0000 | MEDICATED_PAD | Freq: Once | CUTANEOUS | Status: AC
  Administered 2017-08-15: 6 via TOPICAL

## 2017-08-15 MED ORDER — SUGAMMADEX SODIUM 200 MG/2ML IV SOLN
INTRAVENOUS | Status: DC | PRN
  Administered 2017-08-15: 200 mg via INTRAVENOUS

## 2017-08-15 MED ORDER — IPRATROPIUM-ALBUTEROL 0.5-2.5 (3) MG/3ML IN SOLN
3.0000 mL | Freq: Once | RESPIRATORY_TRACT | Status: AC
  Administered 2017-08-15: 3 mL via RESPIRATORY_TRACT

## 2017-08-15 MED ORDER — LIDOCAINE HCL 1 % IJ SOLN
INTRAMUSCULAR | Status: DC | PRN
  Administered 2017-08-15: 20 mL

## 2017-08-15 MED ORDER — ONDANSETRON HCL 4 MG/2ML IJ SOLN
INTRAMUSCULAR | Status: AC
  Filled 2017-08-15: qty 2

## 2017-08-15 MED ORDER — GABAPENTIN 300 MG PO CAPS
ORAL_CAPSULE | ORAL | Status: AC
  Filled 2017-08-15: qty 1

## 2017-08-15 MED ORDER — GABAPENTIN 300 MG PO CAPS
300.0000 mg | ORAL_CAPSULE | ORAL | Status: AC
  Administered 2017-08-15: 300 mg via ORAL

## 2017-08-15 MED ORDER — FENTANYL CITRATE (PF) 100 MCG/2ML IJ SOLN
INTRAMUSCULAR | Status: AC
  Administered 2017-08-15: 25 ug via INTRAVENOUS
  Filled 2017-08-15: qty 2

## 2017-08-15 MED ORDER — SUGAMMADEX SODIUM 200 MG/2ML IV SOLN
INTRAVENOUS | Status: AC
  Filled 2017-08-15: qty 2

## 2017-08-15 MED ORDER — MIDAZOLAM HCL 2 MG/2ML IJ SOLN
INTRAMUSCULAR | Status: DC | PRN
  Administered 2017-08-15: 2 mg via INTRAVENOUS

## 2017-08-15 MED ORDER — LACTATED RINGERS IV SOLN
INTRAVENOUS | Status: DC
  Administered 2017-08-15: 11:00:00 via INTRAVENOUS

## 2017-08-15 MED ORDER — FENTANYL CITRATE (PF) 100 MCG/2ML IJ SOLN
INTRAMUSCULAR | Status: DC | PRN
  Administered 2017-08-15 (×2): 50 ug via INTRAVENOUS

## 2017-08-15 MED ORDER — HYDROMORPHONE HCL 1 MG/ML IJ SOLN
INTRAMUSCULAR | Status: AC
  Filled 2017-08-15: qty 1

## 2017-08-15 MED ORDER — ONDANSETRON HCL 4 MG/2ML IJ SOLN
INTRAMUSCULAR | Status: DC | PRN
  Administered 2017-08-15: 4 mg via INTRAVENOUS

## 2017-08-15 MED ORDER — DEXAMETHASONE SODIUM PHOSPHATE 10 MG/ML IJ SOLN
INTRAMUSCULAR | Status: AC
  Filled 2017-08-15: qty 1

## 2017-08-15 MED ORDER — SODIUM CHLORIDE 0.9 % IR SOLN
Status: DC | PRN
  Administered 2017-08-15: 25 mL

## 2017-08-15 MED ORDER — PROPOFOL 10 MG/ML IV BOLUS
INTRAVENOUS | Status: AC
  Filled 2017-08-15: qty 40

## 2017-08-15 MED ORDER — LIDOCAINE HCL (PF) 2 % IJ SOLN
INTRAMUSCULAR | Status: AC
  Filled 2017-08-15: qty 10

## 2017-08-15 MED ORDER — KETOROLAC TROMETHAMINE 30 MG/ML IJ SOLN
INTRAMUSCULAR | Status: DC | PRN
  Administered 2017-08-15: 30 mg via INTRAVENOUS

## 2017-08-15 MED ORDER — IPRATROPIUM-ALBUTEROL 0.5-2.5 (3) MG/3ML IN SOLN
3.0000 mL | RESPIRATORY_TRACT | Status: DC
  Administered 2017-08-15: 3 mL via RESPIRATORY_TRACT

## 2017-08-15 MED ORDER — IPRATROPIUM-ALBUTEROL 0.5-2.5 (3) MG/3ML IN SOLN
RESPIRATORY_TRACT | Status: AC
  Administered 2017-08-15: 3 mL via RESPIRATORY_TRACT
  Filled 2017-08-15: qty 3

## 2017-08-15 MED ORDER — OXYCODONE-ACETAMINOPHEN 5-325 MG PO TABS
1.0000 | ORAL_TABLET | ORAL | Status: DC | PRN
  Administered 2017-08-15: 2 via ORAL

## 2017-08-15 MED ORDER — ACETAMINOPHEN 500 MG PO TABS
1000.0000 mg | ORAL_TABLET | ORAL | Status: AC
  Administered 2017-08-15: 1000 mg via ORAL

## 2017-08-15 MED ORDER — FAMOTIDINE 20 MG PO TABS
ORAL_TABLET | ORAL | Status: AC
  Filled 2017-08-15: qty 1

## 2017-08-15 MED ORDER — HYDROMORPHONE HCL 1 MG/ML IJ SOLN
INTRAMUSCULAR | Status: DC | PRN
  Administered 2017-08-15 (×2): 0.5 mg via INTRAVENOUS

## 2017-08-15 MED ORDER — MIDAZOLAM HCL 2 MG/2ML IJ SOLN
INTRAMUSCULAR | Status: AC
  Filled 2017-08-15: qty 2

## 2017-08-15 MED ORDER — DEXAMETHASONE SODIUM PHOSPHATE 10 MG/ML IJ SOLN
INTRAMUSCULAR | Status: DC | PRN
  Administered 2017-08-15: 4 mg via INTRAVENOUS

## 2017-08-15 MED ORDER — KETOROLAC TROMETHAMINE 30 MG/ML IJ SOLN
INTRAMUSCULAR | Status: AC
  Filled 2017-08-15: qty 1

## 2017-08-15 MED ORDER — CEFAZOLIN SODIUM-DEXTROSE 2-4 GM/100ML-% IV SOLN
2.0000 g | INTRAVENOUS | Status: AC
  Administered 2017-08-15: 2 g via INTRAVENOUS

## 2017-08-15 MED ORDER — FENTANYL CITRATE (PF) 100 MCG/2ML IJ SOLN
INTRAMUSCULAR | Status: AC
  Filled 2017-08-15: qty 2

## 2017-08-15 MED ORDER — ROCURONIUM BROMIDE 100 MG/10ML IV SOLN
INTRAVENOUS | Status: DC | PRN
  Administered 2017-08-15: 40 mg via INTRAVENOUS

## 2017-08-15 MED ORDER — FENTANYL CITRATE (PF) 100 MCG/2ML IJ SOLN
25.0000 ug | INTRAMUSCULAR | Status: DC | PRN
  Administered 2017-08-15 (×4): 25 ug via INTRAVENOUS

## 2017-08-15 MED ORDER — ROCURONIUM BROMIDE 50 MG/5ML IV SOLN
INTRAVENOUS | Status: AC
  Filled 2017-08-15: qty 1

## 2017-08-15 MED ORDER — LIDOCAINE HCL (CARDIAC) PF 100 MG/5ML IV SOSY
PREFILLED_SYRINGE | INTRAVENOUS | Status: DC | PRN
  Administered 2017-08-15: 100 mg via INTRAVENOUS

## 2017-08-15 MED ORDER — PROPOFOL 10 MG/ML IV BOLUS
INTRAVENOUS | Status: DC | PRN
  Administered 2017-08-15: 20 mg via INTRAVENOUS
  Administered 2017-08-15: 180 mg via INTRAVENOUS

## 2017-08-15 MED ORDER — FAMOTIDINE 20 MG PO TABS
20.0000 mg | ORAL_TABLET | Freq: Once | ORAL | Status: AC
  Administered 2017-08-15: 20 mg via ORAL

## 2017-08-15 MED ORDER — ACETAMINOPHEN 500 MG PO TABS
ORAL_TABLET | ORAL | Status: AC
  Filled 2017-08-15: qty 2

## 2017-08-15 MED ORDER — CEFAZOLIN SODIUM-DEXTROSE 2-4 GM/100ML-% IV SOLN
INTRAVENOUS | Status: AC
  Filled 2017-08-15: qty 100

## 2017-08-15 MED ORDER — OXYCODONE-ACETAMINOPHEN 5-325 MG PO TABS: 1.0000 | ORAL_TABLET | ORAL | 0 refills | Status: DC | PRN

## 2017-08-15 SURGICAL SUPPLY — 37 items
APPLIER CLIP ROT 10 11.4 M/L (STAPLE) ×3
CHLORAPREP W/TINT 26ML (MISCELLANEOUS) ×3 IMPLANT
CLIP APPLIE ROT 10 11.4 M/L (STAPLE) ×1 IMPLANT
DECANTER SPIKE VIAL GLASS SM (MISCELLANEOUS) ×9 IMPLANT
DERMABOND ADVANCED (GAUZE/BANDAGES/DRESSINGS) ×2
DERMABOND ADVANCED .7 DNX12 (GAUZE/BANDAGES/DRESSINGS) ×1 IMPLANT
DRESSING SURGICEL FIBRLLR 1X2 (HEMOSTASIS) IMPLANT
DRSG SURGICEL FIBRILLAR 1X2 (HEMOSTASIS)
ELECT REM PT RETURN 9FT ADLT (ELECTROSURGICAL) ×3
ELECTRODE REM PT RTRN 9FT ADLT (ELECTROSURGICAL) ×1 IMPLANT
GLOVE BIO SURGEON STRL SZ 6.5 (GLOVE) ×4 IMPLANT
GLOVE BIO SURGEON STRL SZ7 (GLOVE) ×3 IMPLANT
GLOVE BIO SURGEONS STRL SZ 6.5 (GLOVE) ×2
GLOVE BIOGEL PI IND STRL 7.5 (GLOVE) ×1 IMPLANT
GLOVE BIOGEL PI INDICATOR 7.5 (GLOVE) ×2
GLOVE INDICATOR 7.0 STRL GRN (GLOVE) ×6 IMPLANT
GOWN STRL REUS W/ TWL LRG LVL3 (GOWN DISPOSABLE) ×3 IMPLANT
GOWN STRL REUS W/TWL LRG LVL3 (GOWN DISPOSABLE) ×6
GRASPER SUT TROCAR 14GX15 (MISCELLANEOUS) ×3 IMPLANT
IRRIGATION STRYKERFLOW (MISCELLANEOUS) IMPLANT
IRRIGATOR STRYKERFLOW (MISCELLANEOUS)
IV NS 1000ML (IV SOLUTION) ×2
IV NS 1000ML BAXH (IV SOLUTION) ×1 IMPLANT
KIT TURNOVER KIT A (KITS) ×3 IMPLANT
NEEDLE HYPO 22GX1.5 SAFETY (NEEDLE) ×3 IMPLANT
NEEDLE INSUFFLATION 14GA 120MM (NEEDLE) ×3 IMPLANT
NS IRRIG 1000ML POUR BTL (IV SOLUTION) ×3 IMPLANT
PACK LAP CHOLECYSTECTOMY (MISCELLANEOUS) ×3 IMPLANT
POUCH SPECIMEN RETRIEVAL 10MM (ENDOMECHANICALS) ×3 IMPLANT
SCISSORS METZENBAUM CVD 33 (INSTRUMENTS) IMPLANT
SLEEVE ENDOPATH XCEL 5M (ENDOMECHANICALS) ×6 IMPLANT
SUT MNCRL AB 4-0 PS2 18 (SUTURE) ×3 IMPLANT
SUT VICRYL 0 UR6 27IN ABS (SUTURE) ×3 IMPLANT
SUT VICRYL AB 3-0 FS1 BRD 27IN (SUTURE) ×3 IMPLANT
TROCAR XCEL NON-BLD 11X100MML (ENDOMECHANICALS) ×3 IMPLANT
TROCAR XCEL NON-BLD 5MMX100MML (ENDOMECHANICALS) ×3 IMPLANT
TUBING INSUFFLATION (TUBING) ×3 IMPLANT

## 2017-08-15 NOTE — Discharge Instructions (Signed)
In addition to included general post-operative instructions for Laparoscopic Cholecystectomy,  Diet: Resume home heart healthy diet (as discussed).   Activity: No heavy lifting >20 pounds (children, pets, laundry, garbage) or strenuous activity until follow-up, but light activity and walking are encouraged. Do not drive or drink alcohol if taking narcotic pain medications.  Wound care: 2 days after surgery (Friday, 6/28), you may shower/get incision wet with soapy water and pat dry (do not rub incisions), but no baths or submerging incision underwater until follow-up.   Medications: Resume all home medications. For mild to moderate pain: acetaminophen (Tylenol) or ibuprofen/naproxen (if no kidney disease). Combining Tylenol with alcohol can substantially increase your risk of causing liver disease. Narcotic pain medications, if prescribed, can be used for severe pain, though may cause nausea, constipation, and drowsiness. Do not combine Tylenol and Percocet (or similar) within a 6 hour period as Percocet (and similar) contain(s) Tylenol. If you do not need the narcotic pain medication, you do not need to fill the prescription.  Call office [phone]) at any time if any questions, worsening pain, fevers/chills, bleeding, drainage from incision site, or other concerns.

## 2017-08-15 NOTE — Interval H&P Note (Signed)
History and Physical Interval Note:  08/15/2017 11:47 AM  Brittney Boyle  has presented today for surgery, with the diagnosis of CALCULUS OF GB  The various methods of treatment have been discussed with the patient and family. After consideration of risks, benefits and other options for treatment, the patient has consented to  Procedure(s): LAPAROSCOPIC CHOLECYSTECTOMY (N/A) as a surgical intervention .  The patient's history has been reviewed, patient examined, no change in status, stable for surgery.  I have reviewed the patient's chart and labs.  Questions were answered to the patient's satisfaction.     Ancil LinseyJason Evan Nahome Bublitz

## 2017-08-15 NOTE — Anesthesia Post-op Follow-up Note (Signed)
Anesthesia QCDR form completed.        

## 2017-08-15 NOTE — Transfer of Care (Signed)
Immediate Anesthesia Transfer of Care Note  Patient: Brittney Boyle  Procedure(s) Performed: LAPAROSCOPIC CHOLECYSTECTOMY (N/A )  Patient Location: PACU  Anesthesia Type:General  Level of Consciousness: awake  Airway & Oxygen Therapy: Patient Spontanous Breathing  Post-op Assessment: Report given to RN  Post vital signs: stable  Last Vitals:  Vitals Value Taken Time  BP 112/70 08/15/2017  1:40 PM  Temp 36.7 C 08/15/2017  1:40 PM  Pulse 102 08/15/2017  1:45 PM  Resp 20 08/15/2017  1:45 PM  SpO2 100 % 08/15/2017  1:45 PM  Vitals shown include unvalidated device data.  Last Pain:  Vitals:   08/15/17 1340  TempSrc:   PainSc: 7          Complications: No apparent anesthesia complications

## 2017-08-15 NOTE — Anesthesia Preprocedure Evaluation (Signed)
Anesthesia Evaluation  Patient identified by MRN, date of birth, ID band Patient awake    Reviewed: Allergy & Precautions, NPO status , Patient's Chart, lab work & pertinent test results  Airway Mallampati: II  TM Distance: >3 FB     Dental   Pulmonary neg pulmonary ROS,    Pulmonary exam normal        Cardiovascular negative cardio ROS Normal cardiovascular exam     Neuro/Psych  Headaches, PSYCHIATRIC DISORDERS Anxiety Depression    GI/Hepatic Neg liver ROS, GERD  Medicated and Controlled,  Endo/Other    Renal/GU   negative genitourinary   Musculoskeletal negative musculoskeletal ROS (+)   Abdominal Normal abdominal exam  (+)   Peds negative pediatric ROS (+)  Hematology  (+) anemia ,   Anesthesia Other Findings   Reproductive/Obstetrics                             Anesthesia Physical Anesthesia Plan  ASA: II  Anesthesia Plan: General   Post-op Pain Management:    Induction: Intravenous  PONV Risk Score and Plan:   Airway Management Planned: Oral ETT  Additional Equipment:   Intra-op Plan:   Post-operative Plan: Extubation in OR  Informed Consent: I have reviewed the patients History and Physical, chart, labs and discussed the procedure including the risks, benefits and alternatives for the proposed anesthesia with the patient or authorized representative who has indicated his/her understanding and acceptance.   Dental advisory given  Plan Discussed with: CRNA and Surgeon  Anesthesia Plan Comments:         Anesthesia Quick Evaluation

## 2017-08-15 NOTE — Anesthesia Procedure Notes (Signed)
Procedure Name: Intubation Date/Time: 08/15/2017 12:28 PM Performed by: Ginger CarneMichelet, Jaleeyah Munce, CRNA Pre-anesthesia Checklist: Patient identified, Emergency Drugs available, Suction available, Patient being monitored and Timeout performed Patient Re-evaluated:Patient Re-evaluated prior to induction Oxygen Delivery Method: Circle system utilized Preoxygenation: Pre-oxygenation with 100% oxygen Induction Type: IV induction Ventilation: Mask ventilation without difficulty Laryngoscope Size: Miller and 2 Grade View: Grade I Tube type: Oral Tube size: 7.0 mm Number of attempts: 1 Airway Equipment and Method: Stylet Secured at: 21 cm Tube secured with: Tape Dental Injury: Teeth and Oropharynx as per pre-operative assessment

## 2017-08-15 NOTE — Anesthesia Postprocedure Evaluation (Signed)
Anesthesia Post Note  Patient: Brittney DaltonKinsleigh Boyle  Procedure(s) Performed: LAPAROSCOPIC CHOLECYSTECTOMY (N/A )  Patient location during evaluation: PACU Anesthesia Type: General Level of consciousness: awake and alert and oriented Pain management: pain level controlled Vital Signs Assessment: post-procedure vital signs reviewed and stable Respiratory status: spontaneous breathing Cardiovascular status: blood pressure returned to baseline Anesthetic complications: no     Last Vitals:  Vitals:   08/15/17 1600 08/15/17 1621  BP: 101/66 (!) 114/58  Pulse: 99 (!) 101  Resp: 15 16  Temp:  (!) 35.9 C  SpO2: 96% 97%    Last Pain:  Vitals:   08/15/17 1621  TempSrc: Temporal  PainSc: 7                  Jazmin Vensel

## 2017-08-15 NOTE — Op Note (Signed)
SURGICAL OPERATIVE REPORT   DATE OF PROCEDURE: 08/15/2017  ATTENDING Surgeon(s): Ancil Linseyavis, Graceland Wachter Evan, MD  ANESTHESIA: GETA  PRE-OPERATIVE DIAGNOSIS: Symptomatic Cholelithiasis (K80.20)  POST-OPERATIVE DIAGNOSIS: Symptomatic Cholelithiasis (K80.20)  PROCEDURE(S): (cpt's: 47562) 1.) Laparoscopic Cholecystectomy  INTRAOPERATIVE FINDINGS: Minimal pericholecystic inflammation with cystic duct and cystic artery clips well-secured, hemostasis at completion of procedure  INTRAOPERATIVE FLUIDS: 900 mL crystalloid   ESTIMATED BLOOD LOSS: Minimal (<30 mL)   URINE OUTPUT: No foley  SPECIMENS: Gallbladder  IMPLANTS: None  DRAINS: None   COMPLICATIONS: None apparent   CONDITION AT COMPLETION: Hemodynamically stable and extubated  DISPOSITION: PACU   INDICATION(S) FOR PROCEDURE:  Patient is a 22 y.o. female who recently presented with post-prandial RUQ > epigastric abdominal pain after eating fatty foods in particular. Ultrasound suggested cholelithiasis without sonographic evidence of cholecystitis. All risks, benefits, and alternatives to above elective procedures were discussed with the patient, who elected to proceed, and informed consent was accordingly obtained at that time.   DETAILS OF PROCEDURE:  Patient was brought to the operating suite and appropriately identified. General anesthesia was administered along with peri-operative prophylactic IV antibiotics, and endotracheal intubation was performed by anesthesiologist, along with NG/OG tube for gastric decompression. In supine position, operative site was prepped and draped in usual sterile fashion, and following a brief time out, initial 5 mm incision was made in a natural skin crease just above the umbilicus. Fascia was then elevated, and a Verress needle was inserted and its proper position confirmed using aspiration and saline meniscus test.  Upon insufflation of the abdominal cavity with carbon dioxide to a well-tolerated  pressure of 12-15 mmHg, 5 mm peri-umbilical port followed by laparoscope were inserted and used to inspect the abdominal cavity and its contents with no injuries from insertion of the first trochar noted. Three additional trocars were inserted, one at the epigastric position (10 mm) and two along the Right costal margin (5 mm). The table was then placed in reverse Trendelenburg position with the Right side up. Filmy adhesions between the gallbladder and omentum/duodenum/transverse colon were lysed using combined blunt dissection and selective electrocautery. The apex/dome of the gallbladder was grasped with an atraumatic grasper passed through the lateral port and retracted apically over the liver. The infundibulum was also grasped and retracted, exposing Calot's triangle. The peritoneum overlying the gallbladder infundibulum was incised and dissected free of surrounding peritoneal attachments, revealing the cystic duct and cystic artery, which were clipped twice on the patient side and once on the gallbladder specimen side close to the gallbladder. The gallbladder was then dissected from its peritoneal attachments to the liver using electrocautery, and the gallbladder was placed into a laparoscopic specimen bag and removed from the abdominal cavity via the epigastric port site. Hemostasis and secure placement of clips were confirmed, and intra-peritoneal cavity was inspected with no additional findings. PMI laparoscopic fascial closure device was then used to re-approximate fascia at the 10 mm epigastric port site.  All ports were then removed under direct visualization, and abdominal cavity was desuflated. All port sites were irrigated/cleaned, additional local anesthetic was injected at each incision, 3-0 Vicryl was used to re-approximate dermis at 10 mm port site(s), and subcuticular 4-0 Monocryl suture was used to re-approximate skin. Skin was then cleaned, dried, and sterile skin glue was applied. Patient  was then safely able to be awakened, extubated, and transferred to PACU for post-operative monitoring and care.   I was present for all aspects of the above procedure, and no operative complications were apparent.

## 2017-08-16 ENCOUNTER — Encounter: Payer: Self-pay | Admitting: Surgery

## 2017-08-16 LAB — SURGICAL PATHOLOGY

## 2017-08-17 DIAGNOSIS — K8020 Calculus of gallbladder without cholecystitis without obstruction: Secondary | ICD-10-CM

## 2017-08-30 ENCOUNTER — Ambulatory Visit (INDEPENDENT_AMBULATORY_CARE_PROVIDER_SITE_OTHER): Payer: BLUE CROSS/BLUE SHIELD | Admitting: Surgery

## 2017-08-30 ENCOUNTER — Encounter: Payer: Self-pay | Admitting: Surgery

## 2017-08-30 VITALS — BP 127/77 | HR 103 | Temp 98.1°F | Ht 64.0 in | Wt 204.4 lb

## 2017-08-30 DIAGNOSIS — K8020 Calculus of gallbladder without cholecystitis without obstruction: Secondary | ICD-10-CM

## 2017-08-30 NOTE — Patient Instructions (Signed)

## 2017-08-31 ENCOUNTER — Encounter: Payer: Self-pay | Admitting: Surgery

## 2017-08-31 NOTE — Progress Notes (Signed)
Surgical Clinic Progress/Follow-up Note   HPI:  22 y.o. Female presents to clinic for post-op follow-up  2 weeks s/p laparoscopic cholecystectomy Earlene Plater([place], 08/15/2017). Patient reports complete resolution of pre-operative pain and has been tolerating regular diet with +flatus and normal BM's (occasionally mildly loose with more fatty foods), denies N/V, fever/chills, CP, or SOB.  Review of Systems:  Constitutional: denies fever/chills  Respiratory: denies shortness of breath, wheezing  Cardiovascular: denies chest pain, palpitations  Gastrointestinal: abdominal pain, N/V, and bowel function as per interval history Skin: Denies any other rashes or skin discolorations except post-surgical wounds as per interval history  Vital Signs:  BP 127/77   Pulse (!) 103   Temp 98.1 F (36.7 C) (Oral)   Ht 5\' 4"  (1.626 m)   Wt 204 lb 6.4 oz (92.7 kg)   LMP 08/01/2017 (Approximate)   BMI 35.09 kg/m    Physical Exam:  Constitutional:  -- Obese body habitus  -- Awake, alert, and oriented x3  Pulmonary:  -- No crackles -- Equal breath sounds bilaterally -- Breathing non-labored at rest Cardiovascular:  -- S1, S2 present  -- No pericardial rubs  Gastrointestinal:  -- Soft and non-distended, non-tender to palpation, no guarding/rebound tenderness -- Post-surgical incisions all well-approximated without any peri-incisional erythema or drainage -- No abdominal masses appreciated, pulsatile or otherwise  Musculoskeletal / Integumentary:  -- Wounds or skin discoloration: None appreciated except post-surgical incisions as described above (GI) -- Extremities: B/L UE and LE FROM, hands and feet warm   Assessment:  22 y.o. yo Female with a problem list including...  Patient Active Problem List   Diagnosis Date Noted  . Calculus of gallbladder without cholecystitis without obstruction   . Anxiety and depression 03/13/2017  . Factor V Leiden (HCC) 03/13/2017  . Migraine without aura and without  status migrainosus, not intractable 03/13/2017  . Sleep disturbance 03/13/2017    presents to clinic for post-op follow-up evaluation, doing well 2 weeks s/p laparoscopic cholecystectomy Earlene Plater([place], 08/15/2017) for symptomatic cholelithiasis.  Plan:              - advance diet as tolerated              - okay to submerge incisions under water (baths, swimming) prn             - gradually resume all activities without restrictions over next 2 weeks             - apply sunblock particularly to incisions with sun exposure to reduce pigmentation of scars             - return to clinic as needed, instructed to call office if any questions or concerns  All of the above recommendations were discussed with the patient, and all of patient's questions were answered to her expressed satisfaction.  -- Scherrie GerlachJason E. Earlene Plateravis, MD, RPVI [place]: [facility] General Surgery - Partnering for exceptional care. Office: [phone]

## 2017-10-15 ENCOUNTER — Ambulatory Visit
Admission: EM | Admit: 2017-10-15 | Discharge: 2017-10-15 | Disposition: A | Discharge status: Home / Self Care | Payer: BLUE CROSS/BLUE SHIELD | Attending: Family Medicine | Admitting: Family Medicine

## 2017-10-15 ENCOUNTER — Encounter: Payer: Self-pay | Admitting: Emergency Medicine

## 2017-10-15 ENCOUNTER — Other Ambulatory Visit: Payer: Self-pay

## 2017-10-15 DIAGNOSIS — R197 Diarrhea, unspecified: Secondary | ICD-10-CM | POA: Diagnosis not present

## 2017-10-15 DIAGNOSIS — R112 Nausea with vomiting, unspecified: Secondary | ICD-10-CM

## 2017-10-15 MED ORDER — PROMETHAZINE HCL 25 MG/ML IJ SOLN
25.0000 mg | Freq: Once | INTRAMUSCULAR | Status: AC
  Administered 2017-10-15: 25 mg via INTRAMUSCULAR

## 2017-10-15 NOTE — Discharge Instructions (Addendum)
Take zofran at home as needed. Rest. Drink plenty of fluids. BRAT diet.   Follow up with your primary care physician this week as needed. Return to Urgent care for new or worsening concerns.

## 2017-10-15 NOTE — ED Triage Notes (Signed)
Patient reports vomiting that started on Saturday and has since resolved but now she is having diarrhea that started yesterday.

## 2017-10-15 NOTE — ED Provider Notes (Signed)
[facility] ____________________________________________  Time seen: Approximately 1:54 PM  I have reviewed the triage vital signs and the nursing notes.   HISTORY  Chief Complaint Diarrhea and Emesis   HPI Brittney Boyle is a 22 y.o. female presenting for evaluation of nausea, vomiting diarrhea.  Reports Saturday night to Sunday morning vomiting started at 1:30 AM and continued until approximately 4 AM this morning with around 10 episodes of vomiting that ended up greenish in color.  Reports last night around 10 PM she started having episodes of diarrhea, which she has had several episodes.  Denies any bloody appearance or black stool or vomit.  Has not been able to tolerate food or fluids thus far.  States that she did take a Zofran ODT tablet yesterday morning which did help with the nausea.  No other over-the-counter medications or prescription medications taken for the same complaints.  Does not take any medications today.  States that she has Zofran left over from previous gallbladder issues in which she states she still has several Zofran at home.  Denies known sick contacts.  States that she feels that she may have gotten this from eating at [employer] Saturday evening around 4 PM.  But denies specific known food trigger.  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  No accompanying fevers.  States generalized crampy abdominal pain but no 1 specific abdominal tenderness area.  Denies other relieving factors.  Reports otherwise feels well.  Care, [facility]: PCP Patient's last menstrual period was 09/24/2017 (approximate).Denies pregnancy.     Past Medical History:  Diagnosis Date  . Anemia   . Depression   . GERD (gastroesophageal reflux disease)   . History of kidney stones    H/O  . IBS (irritable bowel syndrome)   . Kidney stone   . Migraines     Patient Active Problem List   Diagnosis Date Noted  . Anxiety and depression 03/13/2017  .  Factor V Leiden (HCC) 03/13/2017  . Migraine without aura and without status migrainosus, not intractable 03/13/2017  . Sleep disturbance 03/13/2017    Past Surgical History:  Procedure Laterality Date  . CHOLECYSTECTOMY N/A 08/15/2017   Procedure: LAPAROSCOPIC CHOLECYSTECTOMY;  Surgeon: Ancil Linseyavis, Jason Evan, MD;  Location: [facility] ORS;  Service: General;  Laterality: N/A;  . TONSILLECTOMY    . WISDOM TOOTH EXTRACTION       No current facility-administered medications for this encounter.   Current Outpatient Medications:  .  Levonorgestrel-Ethinyl Estradiol (AMETHIA,CAMRESE) 0.1-0.02 & 0.01 MG tablet, Take 1 tablet by mouth daily., Disp: , Rfl: 5 .  Melatonin 10 MG TABS, Take 20 mg by mouth at bedtime., Disp: , Rfl:  .  Multiple Vitamin (MULTIVITAMIN WITH MINERALS) TABS tablet, Take 1 tablet by mouth daily., Disp: , Rfl:  .  tamsulosin (FLOMAX) 0.4 MG CAPS capsule, Take 0.4 mg by mouth daily after breakfast. , Disp: , Rfl:  .  venlafaxine XR (EFFEXOR-XR) 150 MG 24 hr capsule, Take 300 mg by mouth daily with breakfast. , Disp: , Rfl:  .  aspirin-acetaminophen-caffeine (EXCEDRIN MIGRAINE) 250-250-65 MG tablet, Take 1 tablet by mouth daily as needed for migraine., Disp: , Rfl:   Allergies Sulfa antibiotics and Shellfish allergy  Family History  Problem Relation Age of Onset  . Endometrial cancer Maternal Grandmother   . Thyroid cancer Paternal Grandmother   . Breast cancer Other     Social History Social History   Tobacco Use  . Smoking status: Never Smoker  .  Smokeless tobacco: Never Used  Substance Use Topics  . Alcohol use: Never    Frequency: Never  . Drug use: Never    Review of Systems Constitutional: No fever/chills Cardiovascular: Denies chest pain. Respiratory: Denies shortness of breath. Gastrointestinal:AS above.  Genitourinary: Negative for dysuria. Musculoskeletal: Negative for back pain. Skin: Negative for  rash.   ____________________________________________   PHYSICAL EXAM:  VITAL SIGNS: ED Triage Vitals  Enc Vitals Group     BP 10/15/17 1324 112/73     Pulse Rate 10/15/17 1324 87     Resp 10/15/17 1324 16     Temp 10/15/17 1324 98.6 F (37 C)     Temp Source 10/15/17 1324 Oral     SpO2 10/15/17 1324 99 %     Weight 10/15/17 1321 208 lb (94.3 kg)     Height 10/15/17 1321 5\' 4"  (1.626 m)     Head Circumference --      Peak Flow --      Pain Score 10/15/17 1321 7     Pain Loc --      Pain Edu? --      Excl. in GC? --     Constitutional: Alert and oriented. Well appearing and in no acute distress. ENT      Head: Normocephalic and atraumatic.      Mouth/Throat: Mucous membranes are moist.Oropharynx non-erythematous.Good peripheral circulation. Respiratory: Normal respiratory effort without tachypnea nor retractions. Breath sounds are clear and equal bilaterally. No wheezes, rales, rhonchi. Gastrointestinal:Normal Bowel sounds.  Mild epigastric and bilateral lower quadrant tenderness, abdomen otherwise soft and nontender.  No CVA tenderness. Musculoskeletal:  No midline cervical, thoracic or lumbar tenderness to palpation.  Neurologic:  Normal speech and language. Speech is normal. No gait instability.  Skin:  Skin is warm, dry and intact. No rash noted. Psychiatric: Mood and affect are normal. Speech and behavior are normal. Patient exhibits appropriate insight and judgment   ___________________________________________   LABS (all labs ordered are listed, but only abnormal results are displayed)  Labs Reviewed - No data to display  PROCEDURES Procedures   INITIAL IMPRESSION / ASSESSMENT AND PLAN / ED COURSE  Pertinent labs & imaging results that were available during my care of the patient were reviewed by me and considered in my medical decision making (see chart for details).  Very well-appearing patient.  No acute distress.  Suspect viral versus food related.   Encourage supportive care.  25 mg IM Phenergan given once in urgent care.  Encourage brat diet.  Work note given for today and tomorrow.  Discussed follow-up and return parameters.Discussed indication, risks and benefits of medications with patient.  Discussed follow up with Primary care physician this week as needed. Discussed follow up and return parameters including no resolution or any worsening concerns. Patient verbalized understanding and agreed to plan.   ____________________________________________   FINAL CLINICAL IMPRESSION(S) / ED DIAGNOSES  Final diagnoses:  Nausea vomiting and diarrhea     ED Discharge Orders    None       Note: This dictation was prepared with Dragon dictation along with smaller phrase technology. Any transcriptional errors that result from this process are unintentional.         Renford Dills, NP 10/15/17 1358

## 2017-12-21 ENCOUNTER — Encounter: Payer: Self-pay | Admitting: Emergency Medicine

## 2017-12-21 ENCOUNTER — Emergency Department: Payer: BLUE CROSS/BLUE SHIELD

## 2017-12-21 ENCOUNTER — Emergency Department
Admission: EM | Admit: 2017-12-21 | Discharge: 2017-12-21 | Disposition: A | Discharge status: Home / Self Care | Payer: BLUE CROSS/BLUE SHIELD | Attending: Emergency Medicine | Admitting: Emergency Medicine

## 2017-12-21 DIAGNOSIS — N201 Calculus of ureter: Secondary | ICD-10-CM | POA: Insufficient documentation

## 2017-12-21 DIAGNOSIS — Z79899 Other long term (current) drug therapy: Secondary | ICD-10-CM | POA: Diagnosis not present

## 2017-12-21 DIAGNOSIS — R112 Nausea with vomiting, unspecified: Secondary | ICD-10-CM | POA: Diagnosis not present

## 2017-12-21 DIAGNOSIS — R1031 Right lower quadrant pain: Secondary | ICD-10-CM | POA: Diagnosis present

## 2017-12-21 LAB — URINALYSIS, COMPLETE (UACMP) WITH MICROSCOPIC
BILIRUBIN URINE: NEGATIVE
GLUCOSE, UA: NEGATIVE mg/dL
KETONES UR: NEGATIVE mg/dL
Nitrite: NEGATIVE
PH: 6 (ref 5.0–8.0)
Protein, ur: 100 mg/dL — AB
Specific Gravity, Urine: 1.024 (ref 1.005–1.030)
WBC, UA: >50 WBC/hpf — ABNORMAL HIGH (ref 0–5)

## 2017-12-21 LAB — COMPREHENSIVE METABOLIC PANEL
ALK PHOS: 119 U/L (ref 38–126)
ALT: 26 U/L (ref 0–44)
ANION GAP: 10 (ref 5–15)
AST: 33 U/L (ref 15–41)
Albumin: 3.8 g/dL (ref 3.5–5.0)
BUN: 10 mg/dL (ref 6–20)
CALCIUM: 9 mg/dL (ref 8.9–10.3)
CO2: 25 mmol/L (ref 22–32)
Chloride: 107 mmol/L (ref 98–111)
Creatinine, Ser: 1.08 mg/dL — ABNORMAL HIGH (ref 0.44–1.00)
GFR calc non Af Amer: >60 mL/min (ref >60)
Glucose, Bld: 117 mg/dL — ABNORMAL HIGH (ref 70–99)
Potassium: 3.5 mmol/L (ref 3.5–5.1)
SODIUM: 142 mmol/L (ref 135–145)
TOTAL PROTEIN: 8.2 g/dL — AB (ref 6.5–8.1)
Total Bilirubin: 0.5 mg/dL (ref 0.3–1.2)

## 2017-12-21 LAB — CBC
HCT: 39.5 % (ref 36.0–46.0)
Hemoglobin: 12.6 g/dL (ref 12.0–15.0)
MCH: 26.6 pg (ref 26.0–34.0)
MCHC: 31.9 g/dL (ref 30.0–36.0)
MCV: 83.3 fL (ref 80.0–100.0)
NRBC: 0 % (ref 0.0–0.2)
PLATELETS: 315 10*3/uL (ref 150–400)
RBC: 4.74 MIL/uL (ref 3.87–5.11)
RDW: 14.5 % (ref 11.5–15.5)
WBC: 7.5 10*3/uL (ref 4.0–10.5)

## 2017-12-21 LAB — LIPASE, BLOOD: Lipase: 34 U/L (ref 11–51)

## 2017-12-21 MED ORDER — ONDANSETRON HCL 4 MG/2ML IJ SOLN
4.0000 mg | Freq: Once | INTRAMUSCULAR | Status: AC | PRN
  Administered 2017-12-21: 4 mg via INTRAVENOUS
  Filled 2017-12-21: qty 2

## 2017-12-21 MED ORDER — SODIUM CHLORIDE 0.9 % IV BOLUS
1000.0000 mL | Freq: Once | INTRAVENOUS | Status: AC
  Administered 2017-12-21: 1000 mL via INTRAVENOUS

## 2017-12-21 MED ORDER — KETOROLAC TROMETHAMINE 30 MG/ML IJ SOLN
30.0000 mg | Freq: Once | INTRAMUSCULAR | Status: AC
  Administered 2017-12-21: 30 mg via INTRAVENOUS
  Filled 2017-12-21: qty 1

## 2017-12-21 MED ORDER — MORPHINE SULFATE (PF) 4 MG/ML IV SOLN
4.0000 mg | Freq: Once | INTRAVENOUS | Status: AC
  Administered 2017-12-21: 4 mg via INTRAVENOUS
  Filled 2017-12-21: qty 1

## 2017-12-21 MED ORDER — IOPAMIDOL (ISOVUE-300) INJECTION 61%
100.0000 mL | Freq: Once | INTRAVENOUS | Status: AC | PRN
  Administered 2017-12-21: 100 mL via INTRAVENOUS

## 2017-12-21 MED ORDER — HYDROCODONE-ACETAMINOPHEN 5-325 MG PO TABS: 1.0000 | ORAL_TABLET | Freq: Four times a day (QID) | ORAL | 0 refills | Status: AC | PRN

## 2017-12-21 MED ORDER — IBUPROFEN 600 MG PO TABS: 600.0000 mg | ORAL_TABLET | Freq: Four times a day (QID) | ORAL | 0 refills | Status: DC | PRN

## 2017-12-21 NOTE — ED Notes (Signed)
NEGATIVE pregnancy test.  

## 2017-12-21 NOTE — Discharge Instructions (Signed)
Take the ibuprofen as needed for pain, and the Vicodin only if needed for more severe pain.  Return to the ER for new, worsening, persistent severe pain, fever, vomiting, or any other new or worsening symptoms that concern you.

## 2017-12-21 NOTE — ED Provider Notes (Signed)
[facility] Emergency Department Provider Note ____________________________________________   First MD Initiated Contact with Patient 12/21/17 1510     (approximate)  I have reviewed the triage vital signs and the nursing notes.   HISTORY  Chief Complaint Abdominal Pain    HPI Brittney Boyle is a 22 y.o. female with PMH as noted below who presents with right lower quadrant pain, acute onset approximately 15 hours ago, constant since then, non radiating, and associated with nausea and multiple episodes of vomiting.  The patient denies associated urinary symptoms, vaginal bleeding or discharge, or fever.  She has had kidney stones in the past but states that this feels different.  She also had a cholecystectomy several months ago.  Past Medical History:  Diagnosis Date  . Anemia   . Depression   . GERD (gastroesophageal reflux disease)   . History of kidney stones    H/O  . IBS (irritable bowel syndrome)   . Kidney stone   . Migraines     Patient Active Problem List   Diagnosis Date Noted  . Anxiety and depression 03/13/2017  . Factor V Leiden (HCC) 03/13/2017  . Migraine without aura and without status migrainosus, not intractable 03/13/2017  . Sleep disturbance 03/13/2017    Past Surgical History:  Procedure Laterality Date  . CHOLECYSTECTOMY N/A 08/15/2017   Procedure: LAPAROSCOPIC CHOLECYSTECTOMY;  Surgeon: Ancil Linsey, MD;  Location: [facility] ORS;  Service: General;  Laterality: N/A;  . TONSILLECTOMY    . WISDOM TOOTH EXTRACTION      Prior to Admission medications   Medication Sig Start Date End Date Taking? Authorizing Provider  aspirin-acetaminophen-caffeine (EXCEDRIN MIGRAINE) [phone] MG tablet Take 1 tablet by mouth daily as needed for migraine.    [provider]  HYDROcodone-acetaminophen (NORCO) 5-325 MG tablet Take 1 tablet by mouth every 6 (six) hours as needed for up to 5 days for moderate pain. 12/21/17 12/26/17   Dionne Bucy, MD  ibuprofen (ADVIL,MOTRIN) 600 MG tablet Take 1 tablet (600 mg total) by mouth every 6 (six) hours as needed. 12/21/17   Dionne Bucy, MD  Levonorgestrel-Ethinyl Estradiol (AMETHIA,CAMRESE) 0.1-0.02 & 0.01 MG tablet Take 1 tablet by mouth daily. 08/15/17   [provider]  Melatonin 10 MG TABS Take 20 mg by mouth at bedtime.    [provider]  Multiple Vitamin (MULTIVITAMIN WITH MINERALS) TABS tablet Take 1 tablet by mouth daily.    [provider]  tamsulosin (FLOMAX) 0.4 MG CAPS capsule Take 0.4 mg by mouth daily after breakfast.  03/13/17   [provider]  venlafaxine XR (EFFEXOR-XR) 150 MG 24 hr capsule Take 300 mg by mouth daily with breakfast.  03/14/17 03/14/18  [provider]    Allergies Sulfa antibiotics and Shellfish allergy  Family History  Problem Relation Age of Onset  . Endometrial cancer Maternal Grandmother   . Thyroid cancer Paternal Grandmother   . Breast cancer Other     Social History Social History   Tobacco Use  . Smoking status: Never Smoker  . Smokeless tobacco: Never Used  Substance Use Topics  . Alcohol use: Never    Frequency: Never  . Drug use: Never    Review of Systems  Constitutional: No fever. Eyes: No redness. ENT: No sore throat. Cardiovascular: Denies chest pain. Respiratory: Denies shortness of breath. Gastrointestinal: Positive for vomiting.  Genitourinary: Negative for dysuria.  Musculoskeletal: Negative for back pain. Skin: Negative for rash. Neurological: Negative for headache.   ____________________________________________  PHYSICAL EXAM:  VITAL SIGNS: ED Triage Vitals  Enc Vitals Group     BP 12/21/17 1425 124/72     Pulse Rate 12/21/17 1425 94     Resp 12/21/17 1425 18     Temp 12/21/17 1425 98.1 F (36.7 C)     Temp Source 12/21/17 1425 Oral     SpO2 12/21/17 1425 98 %     Weight 12/21/17 1426 180 lb (81.6 kg)     Height 12/21/17 1426 5'  4" (1.626 m)     Head Circumference --      Peak Flow --      Pain Score 12/21/17 1426 10     Pain Loc --      Pain Edu? --      Excl. in GC? --     Constitutional: Alert and oriented.  Uncomfortable appearing but in no acute distress. Eyes: Conjunctivae are normal.  No scleral icterus. Head: Atraumatic. Nose: No congestion/rhinnorhea. Mouth/Throat: Mucous membranes are moist.   Neck: Normal range of motion.  Cardiovascular: Good peripheral circulation. Respiratory: Normal respiratory effort.  Gastrointestinal: Soft with moderate right lower quadrant tenderness.  No distention.  Genitourinary: No flank tenderness. Musculoskeletal: Extremities warm and well perfused.  Neurologic:  Normal speech and language. No gross focal neurologic deficits are appreciated.  Skin:  Skin is warm and dry. No rash noted. Psychiatric: Mood and affect are normal. Speech and behavior are normal.  ____________________________________________   LABS (all labs ordered are listed, but only abnormal results are displayed)  Labs Reviewed  COMPREHENSIVE METABOLIC PANEL - Abnormal; Notable for the following components:      Result Value   Glucose, Bld 117 (*)    Creatinine, Ser 1.08 (*)    Total Protein 8.2 (*)    All other components within normal limits  URINALYSIS, COMPLETE (UACMP) WITH MICROSCOPIC - Abnormal; Notable for the following components:   Color, Urine AMBER (*)    APPearance CLOUDY (*)    Hgb urine dipstick LARGE (*)    Protein, ur 100 (*)    Leukocytes, UA LARGE (*)    RBC / HPF >50 (*)    WBC, UA >50 (*)    Bacteria, UA FEW (*)    All other components within normal limits  LIPASE, BLOOD  CBC  POC URINE PREG, ED   ____________________________________________  EKG   ____________________________________________  RADIOLOGY  CT abdomen: 2 mm right UVJ stone with hydronephrosis  ____________________________________________   PROCEDURES  Procedure(s) performed:  No  Procedures  Critical Care performed: No ____________________________________________   INITIAL IMPRESSION / ASSESSMENT AND PLAN / ED COURSE  Pertinent labs & imaging results that were available during my care of the patient were reviewed by me and considered in my medical decision making (see chart for details).  22 year old female with PMH as noted above including prior history of kidney stones and a history of a cholecystectomy earlier this year presents with right lower quadrant pain which awoke her from sleep early today, and is associated with nausea and multiple episodes of vomiting.  She has no vaginal bleeding or other pelvic symptoms.  On exam her vital signs are normal, but she is significantly tender in the right lower quadrant.  Initial lab work-up obtained from triage is unremarkable.  Differential includes appendicitis, mesenteric adenitis, colitis, or gynecologic cause such as ruptured ovarian cyst or less likely ovarian torsion.  Given the location of the pain and the concern for appendicitis I will obtain a CT  abdomen.  If it is negative or suggest any findings in the pelvis I will add on a pelvic ultrasound.  ----------------------------------------- 6:49 PM on 12/21/2017 -----------------------------------------  CT showed a 2 mm stone at the right UVJ with some associated hydronephrosis, consistent with the patient's presentation.  The appendix is normal.  Her UA shows RBCs and WBCs.  There are no nitrites, and the patient has no fever or elevated WBC count.  The pain also started very acutely.  These are all consistent with ureteral stone, and there is no clinical evidence for UTI/pyelonephritis.  The patient was still having some pain after the initial morphine.  I gave a dose of Toradol and she is now much more comfortable.  She feels well would like to go home.  I counseled her on the results of the scan and the plan of care.  Return precautions given, and she  expressed understanding. ____________________________________________   FINAL CLINICAL IMPRESSION(S) / ED DIAGNOSES  Final diagnoses:  Ureteral stone      NEW MEDICATIONS STARTED DURING THIS VISIT:  Discharge Medication List as of 12/21/2017  6:18 PM    START taking these medications   Details  HYDROcodone-acetaminophen (NORCO) 5-325 MG tablet Take 1 tablet by mouth every 6 (six) hours as needed for up to 5 days for moderate pain., Starting Fri 12/21/2017, Until Wed 12/26/2017, Normal    ibuprofen (ADVIL,MOTRIN) 600 MG tablet Take 1 tablet (600 mg total) by mouth every 6 (six) hours as needed., Starting Fri 12/21/2017, Normal         Note:  This document was prepared using Dragon voice recognition software and may include unintentional dictation errors.   Dionne Bucy, MD 12/21/17 1850

## 2017-12-21 NOTE — ED Triage Notes (Addendum)
Pt arrived with complaints of sudden onset of RLQ pain that started last night at midnight. PT states the pain feels sharp and radiates to her lower back. Pt states she had her gallbladder removed 6/26. Pt reports 10/12 episodes of vomiting but denies diarrhea.

## 2018-01-31 ENCOUNTER — Ambulatory Visit: Payer: BLUE CROSS/BLUE SHIELD | Admitting: Maternal Newborn

## 2018-02-23 ENCOUNTER — Encounter: Payer: Self-pay | Admitting: Emergency Medicine

## 2018-02-23 ENCOUNTER — Emergency Department
Admission: EM | Admit: 2018-02-23 | Discharge: 2018-02-23 | Disposition: A | Discharge status: Home / Self Care | Payer: BLUE CROSS/BLUE SHIELD | Attending: Emergency Medicine | Admitting: Emergency Medicine

## 2018-02-23 ENCOUNTER — Other Ambulatory Visit: Payer: Self-pay

## 2018-02-23 DIAGNOSIS — Z79899 Other long term (current) drug therapy: Secondary | ICD-10-CM | POA: Insufficient documentation

## 2018-02-23 DIAGNOSIS — R1030 Lower abdominal pain, unspecified: Secondary | ICD-10-CM | POA: Diagnosis present

## 2018-02-23 DIAGNOSIS — N390 Urinary tract infection, site not specified: Secondary | ICD-10-CM | POA: Diagnosis not present

## 2018-02-23 DIAGNOSIS — Z9049 Acquired absence of other specified parts of digestive tract: Secondary | ICD-10-CM | POA: Insufficient documentation

## 2018-02-23 DIAGNOSIS — F419 Anxiety disorder, unspecified: Secondary | ICD-10-CM | POA: Diagnosis not present

## 2018-02-23 DIAGNOSIS — F329 Major depressive disorder, single episode, unspecified: Secondary | ICD-10-CM | POA: Insufficient documentation

## 2018-02-23 LAB — CBC
HEMATOCRIT: 38.9 % (ref 36.0–46.0)
HEMOGLOBIN: 12.2 g/dL (ref 12.0–15.0)
MCH: 26.2 pg (ref 26.0–34.0)
MCHC: 31.4 g/dL (ref 30.0–36.0)
MCV: 83.7 fL (ref 80.0–100.0)
PLATELETS: 301 10*3/uL (ref 150–400)
RBC: 4.65 MIL/uL (ref 3.87–5.11)
RDW: 14.6 % (ref 11.5–15.5)
WBC: 6.6 10*3/uL (ref 4.0–10.5)
nRBC: 0 % (ref 0.0–0.2)

## 2018-02-23 LAB — URINALYSIS, COMPLETE (UACMP) WITH MICROSCOPIC
BILIRUBIN URINE: NEGATIVE
GLUCOSE, UA: NEGATIVE mg/dL
HGB URINE DIPSTICK: NEGATIVE
Ketones, ur: NEGATIVE mg/dL
Nitrite: NEGATIVE
PH: 6 (ref 5.0–8.0)
PROTEIN: NEGATIVE mg/dL
SPECIFIC GRAVITY, URINE: 1.018 (ref 1.005–1.030)

## 2018-02-23 LAB — BASIC METABOLIC PANEL
ANION GAP: 8 (ref 5–15)
BUN: 12 mg/dL (ref 6–20)
CALCIUM: 9.1 mg/dL (ref 8.9–10.3)
CHLORIDE: 107 mmol/L (ref 98–111)
CO2: 22 mmol/L (ref 22–32)
Creatinine, Ser: 0.76 mg/dL (ref 0.44–1.00)
GFR calc Af Amer: >60 mL/min (ref >60)
GFR calc non Af Amer: >60 mL/min (ref >60)
Glucose, Bld: 96 mg/dL (ref 70–99)
POTASSIUM: 3.9 mmol/L (ref 3.5–5.1)
Sodium: 137 mmol/L (ref 135–145)

## 2018-02-23 LAB — POCT PREGNANCY, URINE: Preg Test, Ur: NEGATIVE

## 2018-02-23 MED ORDER — ONDANSETRON HCL 4 MG/2ML IJ SOLN
4.0000 mg | Freq: Once | INTRAMUSCULAR | Status: AC
  Administered 2018-02-23: 4 mg via INTRAVENOUS
  Filled 2018-02-23: qty 2

## 2018-02-23 MED ORDER — SODIUM CHLORIDE 0.9 % IV BOLUS
1000.0000 mL | Freq: Once | INTRAVENOUS | Status: AC
  Administered 2018-02-23: 1000 mL via INTRAVENOUS

## 2018-02-23 MED ORDER — SODIUM CHLORIDE 0.9 % IV SOLN
1.0000 g | Freq: Once | INTRAVENOUS | Status: AC
  Administered 2018-02-23: 1 g via INTRAVENOUS
  Filled 2018-02-23: qty 10

## 2018-02-23 MED ORDER — PHENAZOPYRIDINE HCL 200 MG PO TABS: 200.0000 mg | ORAL_TABLET | Freq: Three times a day (TID) | ORAL | 0 refills | Status: DC | PRN

## 2018-02-23 MED ORDER — CEPHALEXIN 500 MG PO CAPS: 500.0000 mg | ORAL_CAPSULE | Freq: Three times a day (TID) | ORAL | 0 refills | Status: DC

## 2018-02-23 MED ORDER — ONDANSETRON HCL 4 MG PO TABS: 4.0000 mg | ORAL_TABLET | Freq: Three times a day (TID) | ORAL | 0 refills | Status: DC | PRN

## 2018-02-23 NOTE — Discharge Instructions (Signed)
Please seek medical attention for any high fevers, chest pain, shortness of breath, change in behavior, persistent vomiting, bloody stool or any other new or concerning symptoms.  

## 2018-02-23 NOTE — ED Notes (Signed)
Patient verbalized understanding of discharge instructions, no questions. Patient ambulated out of ED with steady gait in no distress.  

## 2018-02-23 NOTE — ED Triage Notes (Signed)
Pt arrived with complaints of bilateral flank pain that started last night. Pt states she has a burning sensation when she urinates. Pt reports recurrent UTI but states she has never experienced it on both sides before. Pt reports nausea but denies emesis or diarrhea.

## 2018-02-23 NOTE — ED Provider Notes (Signed)
[facility] Emergency Department Provider Note ____________________________________________   I have reviewed the triage vital signs and the nursing notes.   HISTORY  Chief Complaint Flank Pain   History limited by: Not Limited   HPI Brittney Boyle is a 23 y.o. female who presents to the emergency department today because of concern for bilateral flank pain. The pain started this morning. It is described as sharp. It has been slightly waxing and waning. The patient states that the pain does radiate to her groin on both sides. This has been accompanied by painful urination. The patient has not noticed any blood in her urine. States she has had kidney stones before but this pain does not remind her of that pain. She has also had kidney infections in the past. She has had some nausea. Has not noticed any fever.   Per medical record review patient has a history of kidney stone  Past Medical History:  Diagnosis Date  . Anemia   . Depression   . GERD (gastroesophageal reflux disease)   . History of kidney stones    H/O  . IBS (irritable bowel syndrome)   . Kidney stone   . Migraines     Patient Active Problem List   Diagnosis Date Noted  . Anxiety and depression 03/13/2017  . Factor V Leiden (HCC) 03/13/2017  . Migraine without aura and without status migrainosus, not intractable 03/13/2017  . Sleep disturbance 03/13/2017    Past Surgical History:  Procedure Laterality Date  . CHOLECYSTECTOMY N/A 08/15/2017   Procedure: LAPAROSCOPIC CHOLECYSTECTOMY;  Surgeon: Ancil Linsey, MD;  Location: [facility] ORS;  Service: General;  Laterality: N/A;  . TONSILLECTOMY    . WISDOM TOOTH EXTRACTION      Prior to Admission medications   Medication Sig Start Date End Date Taking? Authorizing Provider  aspirin-acetaminophen-caffeine (EXCEDRIN MIGRAINE) [phone] MG tablet Take 1 tablet by mouth daily as needed for migraine.    [provider]  ibuprofen  (ADVIL,MOTRIN) 600 MG tablet Take 1 tablet (600 mg total) by mouth every 6 (six) hours as needed. 12/21/17   Dionne Bucy, MD  Levonorgestrel-Ethinyl Estradiol (AMETHIA,CAMRESE) 0.1-0.02 & 0.01 MG tablet Take 1 tablet by mouth daily. 08/15/17   [provider]  Melatonin 10 MG TABS Take 20 mg by mouth at bedtime.    [provider]  Multiple Vitamin (MULTIVITAMIN WITH MINERALS) TABS tablet Take 1 tablet by mouth daily.    [provider]  tamsulosin (FLOMAX) 0.4 MG CAPS capsule Take 0.4 mg by mouth daily after breakfast.  03/13/17   [provider]  venlafaxine XR (EFFEXOR-XR) 150 MG 24 hr capsule Take 300 mg by mouth daily with breakfast.  03/14/17 03/14/18  [provider]    Allergies Sulfa antibiotics and Shellfish allergy  Family History  Problem Relation Age of Onset  . Endometrial cancer Maternal Grandmother   . Thyroid cancer Paternal Grandmother   . Breast cancer Other     Social History Social History   Tobacco Use  . Smoking status: Never Smoker  . Smokeless tobacco: Never Used  Substance Use Topics  . Alcohol use: Never    Frequency: Never  . Drug use: Never    Review of Systems Constitutional: No fever/chills Eyes: No visual changes. ENT: No sore throat. Cardiovascular: Denies chest pain. Respiratory: Denies shortness of breath. Gastrointestinal: Bilateral flank pain, nausea.  Genitourinary: Positive for dysuria. Musculoskeletal: Negative for back pain. Skin: Negative for rash. Neurological: Negative for headaches, focal weakness  or numbness.  ____________________________________________   PHYSICAL EXAM:  VITAL SIGNS: ED Triage Vitals  Enc Vitals Group     BP 02/23/18 1342 111/67     Pulse Rate 02/23/18 1342 75     Resp 02/23/18 1342 18     Temp 02/23/18 1342 98.3 F (36.8 C)     Temp Source 02/23/18 1342 Oral     SpO2 02/23/18 1342 99 %     Weight 02/23/18 1344 198 lb (89.8 kg)     Height 02/23/18  1344 5\' 4"  (1.626 m)     Head Circumference --      Peak Flow --      Pain Score 02/23/18 1343 9   Constitutional: Alert and oriented.  Eyes: Conjunctivae are normal.  ENT      Head: Normocephalic and atraumatic.      Nose: No congestion/rhinnorhea.      Mouth/Throat: Mucous membranes are moist.      Neck: No stridor. Hematological/Lymphatic/Immunilogical: No cervical lymphadenopathy. Cardiovascular: Normal rate, regular rhythm.  No murmurs, rubs, or gallops.  Respiratory: Normal respiratory effort without tachypnea nor retractions. Breath sounds are clear and equal bilaterally. No wheezes/rales/rhonchi. Gastrointestinal: Soft with mild bilateral cva tenderness.  Genitourinary: Deferred Musculoskeletal: Normal range of motion in all extremities. No lower extremity edema. Neurologic:  Normal speech and language. No gross focal neurologic deficits are appreciated.  Skin:  Skin is warm, dry and intact. No rash noted. Psychiatric: Mood and affect are normal. Speech and behavior are normal. Patient exhibits appropriate insight and judgment.  ____________________________________________    LABS (pertinent positives/negatives)  Upreg negative UA clear, moderate leukocytes moderate, 0-5 rbc, 11-20 wbc, rare bacteria, 6-10 squamous epithelial BMP wnl CBC wbc 6.6, hgb 12.2, plt 301  ____________________________________________   EKG  None  ____________________________________________    RADIOLOGY  None  ____________________________________________   PROCEDURES  Procedures  ____________________________________________   INITIAL IMPRESSION / ASSESSMENT AND PLAN / ED COURSE  Pertinent labs & imaging results that were available during my care of the patient were reviewed by me and considered in my medical decision making (see chart for details).   Patient presented to the emergency department today because of concerns for bilateral flank pain that started this morning.   On exam patient has mild bilateral CVA tenderness.  Patient's urine is concerning for infection.  No red blood cells or hemoglobin.  Additionally given bilateral nature I doubt kidney stones.  At this point I think urinary tract infection unlikely.  No leukocytosis and blood and no signs of kidney dysfunction.  Will plan on giving patient IV fluids and antibiotics.  Will plan on discharging with further antibiotics.  Discussed findings and plan with patient.  ____________________________________________   FINAL CLINICAL IMPRESSION(S) / ED DIAGNOSES  Final diagnoses:  Urinary tract infection without hematuria, site unspecified     Note: This dictation was prepared with Dragon dictation. Any transcriptional errors that result from this process are unintentional     Phineas Semen, MD 02/23/18 1550

## 2018-10-17 ENCOUNTER — Encounter (HOSPITAL_COMMUNITY): Payer: Self-pay

## 2018-10-17 ENCOUNTER — Other Ambulatory Visit: Payer: Self-pay

## 2018-10-17 DIAGNOSIS — N12 Tubulo-interstitial nephritis, not specified as acute or chronic: Secondary | ICD-10-CM | POA: Insufficient documentation

## 2018-10-17 DIAGNOSIS — R109 Unspecified abdominal pain: Secondary | ICD-10-CM | POA: Diagnosis present

## 2018-10-17 NOTE — ED Triage Notes (Signed)
Pt reports bilateral flank pain that started this morning. She states that she hasn't urinated since noon and before that it was very dark. Reports a hx of kidney stones. Endorses nausea without vomiting.

## 2018-10-18 ENCOUNTER — Emergency Department (HOSPITAL_COMMUNITY)
Admission: EM | Admit: 2018-10-18 | Discharge: 2018-10-18 | Disposition: A | Discharge status: Home / Self Care | Payer: BC Managed Care – PPO | Attending: Emergency Medicine | Admitting: Emergency Medicine

## 2018-10-18 DIAGNOSIS — N12 Tubulo-interstitial nephritis, not specified as acute or chronic: Secondary | ICD-10-CM

## 2018-10-18 LAB — I-STAT BETA HCG BLOOD, ED (MC, WL, AP ONLY): I-stat hCG, quantitative: <5 m[IU]/mL (ref <5)

## 2018-10-18 LAB — COMPREHENSIVE METABOLIC PANEL
ALT: 15 U/L (ref 0–44)
AST: 21 U/L (ref 15–41)
Albumin: 3.9 g/dL (ref 3.5–5.0)
Alkaline Phosphatase: 105 U/L (ref 38–126)
Anion gap: 13 (ref 5–15)
BUN: 7 mg/dL (ref 6–20)
CO2: 22 mmol/L (ref 22–32)
Calcium: 9.1 mg/dL (ref 8.9–10.3)
Chloride: 104 mmol/L (ref 98–111)
Creatinine, Ser: 0.77 mg/dL (ref 0.44–1.00)
GFR calc Af Amer: >60 mL/min (ref >60)
GFR calc non Af Amer: >60 mL/min (ref >60)
Glucose, Bld: 87 mg/dL (ref 70–99)
Potassium: 4.2 mmol/L (ref 3.5–5.1)
Sodium: 139 mmol/L (ref 135–145)
Total Bilirubin: 0.5 mg/dL (ref 0.3–1.2)
Total Protein: 7.6 g/dL (ref 6.5–8.1)

## 2018-10-18 LAB — URINALYSIS, ROUTINE W REFLEX MICROSCOPIC
Bilirubin Urine: NEGATIVE
Glucose, UA: NEGATIVE mg/dL
Ketones, ur: NEGATIVE mg/dL
Nitrite: NEGATIVE
Protein, ur: 30 mg/dL — AB
Specific Gravity, Urine: 1.02 (ref 1.005–1.030)
WBC, UA: >50 WBC/hpf — ABNORMAL HIGH (ref 0–5)
pH: 6 (ref 5.0–8.0)

## 2018-10-18 LAB — CBC WITH DIFFERENTIAL/PLATELET
Abs Immature Granulocytes: 0.01 10*3/uL (ref 0.00–0.07)
Basophils Absolute: 0.1 10*3/uL (ref 0.0–0.1)
Basophils Relative: 1 %
Eosinophils Absolute: 0.3 10*3/uL (ref 0.0–0.5)
Eosinophils Relative: 4 %
HCT: 42.5 % (ref 36.0–46.0)
Hemoglobin: 13.3 g/dL (ref 12.0–15.0)
Immature Granulocytes: 0 %
Lymphocytes Relative: 42 %
Lymphs Abs: 3.3 10*3/uL (ref 0.7–4.0)
MCH: 27.6 pg (ref 26.0–34.0)
MCHC: 31.3 g/dL (ref 30.0–36.0)
MCV: 88.2 fL (ref 80.0–100.0)
Monocytes Absolute: 0.4 10*3/uL (ref 0.1–1.0)
Monocytes Relative: 5 %
Neutro Abs: 3.7 10*3/uL (ref 1.7–7.7)
Neutrophils Relative %: 48 %
Platelets: 305 10*3/uL (ref 150–400)
RBC: 4.82 MIL/uL (ref 3.87–5.11)
RDW: 14.1 % (ref 11.5–15.5)
WBC: 7.7 10*3/uL (ref 4.0–10.5)
nRBC: 0 % (ref 0.0–0.2)

## 2018-10-18 MED ORDER — LACTATED RINGERS IV BOLUS
2000.0000 mL | Freq: Once | INTRAVENOUS | Status: AC
  Administered 2018-10-18: 02:00:00 2000 mL via INTRAVENOUS

## 2018-10-18 MED ORDER — IBUPROFEN 600 MG PO TABS: 600.0000 mg | ORAL_TABLET | Freq: Four times a day (QID) | ORAL | 0 refills | Status: AC | PRN

## 2018-10-18 MED ORDER — OXYCODONE-ACETAMINOPHEN 5-325 MG PO TABS: 2.0000 | ORAL_TABLET | ORAL | 0 refills | Status: DC | PRN

## 2018-10-18 MED ORDER — FENTANYL CITRATE (PF) 100 MCG/2ML IJ SOLN
50.0000 ug | Freq: Once | INTRAMUSCULAR | Status: AC
  Administered 2018-10-18: 02:00:00 50 ug via INTRAVENOUS
  Filled 2018-10-18: qty 2

## 2018-10-18 MED ORDER — ONDANSETRON HCL 4 MG PO TABS: 4.0000 mg | ORAL_TABLET | Freq: Three times a day (TID) | ORAL | 0 refills | Status: DC | PRN

## 2018-10-18 MED ORDER — HYDROMORPHONE HCL 1 MG/ML IJ SOLN
1.0000 mg | Freq: Once | INTRAMUSCULAR | Status: AC
  Administered 2018-10-18: 05:00:00 1 mg via INTRAVENOUS
  Filled 2018-10-18: qty 1

## 2018-10-18 MED ORDER — SODIUM CHLORIDE 0.9 % IV SOLN
1.0000 g | Freq: Once | INTRAVENOUS | Status: AC
  Administered 2018-10-18: 05:00:00 1 g via INTRAVENOUS
  Filled 2018-10-18: qty 10

## 2018-10-18 MED ORDER — ONDANSETRON HCL 4 MG/2ML IJ SOLN
4.0000 mg | Freq: Once | INTRAMUSCULAR | Status: AC
  Administered 2018-10-18: 02:00:00 4 mg via INTRAVENOUS
  Filled 2018-10-18: qty 2

## 2018-10-18 MED ORDER — KETOROLAC TROMETHAMINE 30 MG/ML IJ SOLN
30.0000 mg | Freq: Once | INTRAMUSCULAR | Status: AC
  Administered 2018-10-18: 02:00:00 30 mg via INTRAVENOUS
  Filled 2018-10-18: qty 1

## 2018-10-18 MED ORDER — CEPHALEXIN 500 MG PO CAPS: 500.0000 mg | ORAL_CAPSULE | Freq: Four times a day (QID) | ORAL | 0 refills | Status: AC

## 2018-10-18 NOTE — ED Provider Notes (Signed)
Emergency Department Provider Note   I have reviewed the triage vital signs and the nursing notes.   HISTORY  Chief Complaint Flank Pain   HPI Brittney Boyle is a 23 y.o. female who presents the emergency department today with right flank pain and feels similar to previous episodes of kidney stones.  Also with dark urine and subsequently decreased urine.  No dysuria.  Some nausea and vomiting.  No diarrhea or constipation.  No rashes.  No trauma.  No vaginal bleeding.  No vaginal discharge. No other associated or modifying symptoms.    Past Medical History:  Diagnosis Date  . Anemia   . Depression   . GERD (gastroesophageal reflux disease)   . History of kidney stones    H/O  . IBS (irritable bowel syndrome)   . Kidney stone   . Migraines     Patient Active Problem List   Diagnosis Date Noted  . Anxiety and depression 03/13/2017  . Factor V Leiden (HCC) 03/13/2017  . Migraine without aura and without status migrainosus, not intractable 03/13/2017  . Sleep disturbance 03/13/2017    Past Surgical History:  Procedure Laterality Date  . CHOLECYSTECTOMY N/A 08/15/2017   Procedure: LAPAROSCOPIC CHOLECYSTECTOMY;  Surgeon: Ancil Linseyavis, Albertina Leise Evan, MD;  Location: [facility] ORS;  Service: General;  Laterality: N/A;  . TONSILLECTOMY    . WISDOM TOOTH EXTRACTION      Current Outpatient Rx  . Order #: [id number] Class: Historical Med  . Order #: [id number] Class: Historical Med  . Order #: [id number] Class: Historical Med  . Order #: [id number] Class: Historical Med  . Order #: [id number] Class: Historical Med  . Order #: [id number] Class: Historical Med  . Order #: [id number] Class: Historical Med  . Order #: [id number] Class: Normal  . Order #: [id number] Class: Normal  . Order #: [id number] Class: Normal  . Order #: [id number] Class: Normal    Allergies Sulfa antibiotics and Shellfish allergy  Family History  Problem Relation Age of Onset  . Endometrial cancer Maternal Grandmother   .  Thyroid cancer Paternal Grandmother   . Breast cancer Other     Social History Social History   Tobacco Use  . Smoking status: Never Smoker  . Smokeless tobacco: Never Used  Substance Use Topics  . Alcohol use: Never    Frequency: Never  . Drug use: Never    Review of Systems  All other systems negative except as documented in the HPI. All pertinent positives and negatives as reviewed in the HPI. ____________________________________________   PHYSICAL EXAM:  VITAL SIGNS: ED Triage Vitals  Enc Vitals Group     BP 10/17/18 2201 (!) 108/92     Pulse Rate 10/17/18 2201 (!) 108     Resp 10/17/18 2201 20     Temp 10/17/18 2201 98.8 F (37.1 C)     Temp Source 10/17/18 2201 Oral     SpO2 10/17/18 2201 99 %    Constitutional: Alert and oriented. Well appearing and in no acute distress. Eyes: Conjunctivae are normal. PERRL. EOMI. Head: Atraumatic. Nose: No congestion/rhinnorhea. Mouth/Throat: Mucous membranes are moist.  Oropharynx non-erythematous. Neck: No stridor.  No meningeal signs.   Cardiovascular: tachycardic rate, regular rhythm. Good peripheral circulation. Grossly normal heart sounds.   Respiratory: Normal respiratory effort.  No retractions. Lungs CTAB. Gastrointestinal: Soft and nontender. No distention.  Musculoskeletal: No lower extremity tenderness nor edema. No gross deformities of extremities. Neurologic:  Normal speech and language. No gross focal neurologic deficits are appreciated.  Skin:  Skin is warm, dry and  intact. No rash noted.   ____________________________________________   LABS (all labs ordered are listed, but only abnormal results are displayed)  Labs Reviewed  URINALYSIS, ROUTINE W REFLEX MICROSCOPIC - Abnormal; Notable for the following components:      Result Value   APPearance CLOUDY (*)    Hgb urine dipstick SMALL (*)    Protein, ur 30 (*)    Leukocytes,Ua LARGE (*)    WBC, UA >50 (*)    Bacteria, UA MANY (*)    Non Squamous  Epithelial 0-5 (*)    All other components within normal limits  URINE CULTURE  CBC WITH DIFFERENTIAL/PLATELET  COMPREHENSIVE METABOLIC PANEL  I-STAT BETA HCG BLOOD, ED ([facility], [facility], [facility] ONLY)   ____________________________________________    INITIAL IMPRESSION / ASSESSMENT AND PLAN / ED COURSE  eval for stone vs pyelo. Symptomatic treatment in mean time.  Labs c/w pyelo. Treated for same. Pain improved. Tolerating PO. Dc w/ symptomatic meds/abx. Urine culture sent. pcp follow up encouraged.     Pertinent labs & imaging results that were available during my care of the patient were reviewed by me and considered in my medical decision making (see chart for details).  A medical screening exam was performed and I feel the patient has had an appropriate workup for their chief complaint at this time and likelihood of emergent condition existing is low. They have been counseled on decision, discharge, follow up and which symptoms necessitate immediate return to the emergency department. They or their family verbally stated understanding and agreement with plan and discharged in stable condition.   ____________________________________________  FINAL CLINICAL IMPRESSION(S) / ED DIAGNOSES  Final diagnoses:  Pyelonephritis     MEDICATIONS GIVEN DURING THIS VISIT:  Medications  lactated ringers bolus 2,000 mL (0 mLs Intravenous Stopped 10/18/18 0337)  ondansetron (ZOFRAN) injection 4 mg (4 mg Intravenous Given 10/18/18 0201)  fentaNYL (SUBLIMAZE) injection 50 mcg (50 mcg Intravenous Given 10/18/18 0201)  ketorolac (TORADOL) 30 MG/ML injection 30 mg (30 mg Intravenous Given 10/18/18 0201)  cefTRIAXone (ROCEPHIN) 1 g in sodium chloride 0.9 % 100 mL IVPB (0 g Intravenous Stopped 10/18/18 0622)  HYDROmorphone (DILAUDID) injection 1 mg (1 mg Intravenous Given 10/18/18 0519)     NEW OUTPATIENT MEDICATIONS STARTED DURING THIS VISIT:  Discharge Medication List as of 10/18/2018  6:10 AM      Note:   This note was prepared with assistance of Dragon voice recognition software. Occasional wrong-word or sound-a-like substitutions may have occurred due to the inherent limitations of voice recognition software.   Hadassah Rana, Corene Cornea, MD 10/18/18 [phone]

## 2018-10-18 NOTE — ED Notes (Signed)
Bladder scan x2. Both times showed 0 mL in bladder scan.

## 2018-10-18 NOTE — ED Notes (Signed)
Ask for urine

## 2018-10-19 LAB — URINE CULTURE: Culture: >=100000 — AB

## 2018-10-20 ENCOUNTER — Telehealth: Payer: Self-pay | Admitting: Emergency Medicine

## 2018-10-20 NOTE — Telephone Encounter (Signed)
Post ED Visit - Positive Culture Follow-up  Culture report reviewed by antimicrobial stewardship pharmacist: [facility] []  Elenor Quinones, Pharm.D. []  Heide Guile, Pharm.D., BCPS AQ-ID []  Parks Neptune, Pharm.D., BCPS []  Alycia Rossetti, Pharm.D., BCPS []  [place], Pharm.D., BCPS, AAHIVP []  Legrand Como, Pharm.D., BCPS, AAHIVP []  Salome Arnt, PharmD, BCPS []  Johnnette Gourd, PharmD, BCPS []  Hughes Better, PharmD, BCPS []  Leeroy Cha, PharmD []  Laqueta Linden, PharmD, BCPS []  Albertina Parr, PharmD  [place] Team [x]  Leodis Sias, PharmD []  Lindell Spar, PharmD []  Royetta Asal, PharmD []  Graylin Shiver, Rph []  Rema Fendt) Glennon Mac, PharmD []  Arlyn Dunning, PharmD []  Netta Cedars, PharmD []  Dia Sitter, PharmD []  Leone Haven, PharmD []  Gretta Arab, PharmD []  Theodis Shove, PharmD []  Peggyann Juba, PharmD []  Reuel Boom, PharmD   Positive urine culture Treated with Cephalexin, organism sensitive to the same and no further patient follow-up is required at this time.  [place] 10/20/2018, 3:31 PM

## 2018-11-17 ENCOUNTER — Emergency Department (HOSPITAL_COMMUNITY)
Admission: EM | Admit: 2018-11-17 | Discharge: 2018-11-17 | Disposition: A | Discharge status: Home / Self Care | Payer: BC Managed Care – PPO | Attending: Emergency Medicine | Admitting: Emergency Medicine

## 2018-11-17 ENCOUNTER — Encounter (HOSPITAL_COMMUNITY): Payer: Self-pay | Admitting: Emergency Medicine

## 2018-11-17 DIAGNOSIS — Z5321 Procedure and treatment not carried out due to patient leaving prior to being seen by health care provider: Secondary | ICD-10-CM | POA: Insufficient documentation

## 2018-11-17 DIAGNOSIS — R110 Nausea: Secondary | ICD-10-CM | POA: Diagnosis not present

## 2018-11-17 DIAGNOSIS — F10929 Alcohol use, unspecified with intoxication, unspecified: Secondary | ICD-10-CM | POA: Diagnosis present

## 2018-11-17 LAB — CBC
HCT: 41.8 % (ref 36.0–46.0)
Hemoglobin: 13.2 g/dL (ref 12.0–15.0)
MCH: 27.8 pg (ref 26.0–34.0)
MCHC: 31.6 g/dL (ref 30.0–36.0)
MCV: 88 fL (ref 80.0–100.0)
Platelets: 146 10*3/uL — ABNORMAL LOW (ref 150–400)
RBC: 4.75 MIL/uL (ref 3.87–5.11)
RDW: 13.7 % (ref 11.5–15.5)
WBC: 8.7 10*3/uL (ref 4.0–10.5)
nRBC: 0 % (ref 0.0–0.2)

## 2018-11-17 LAB — RAPID URINE DRUG SCREEN, HOSP PERFORMED
Amphetamines: NOT DETECTED
Barbiturates: NOT DETECTED
Benzodiazepines: NOT DETECTED
Cocaine: NOT DETECTED
Opiates: NOT DETECTED
Tetrahydrocannabinol: NOT DETECTED

## 2018-11-17 LAB — I-STAT BETA HCG BLOOD, ED (MC, WL, AP ONLY): I-stat hCG, quantitative: <5 m[IU]/mL (ref <5)

## 2018-11-17 LAB — COMPREHENSIVE METABOLIC PANEL
ALT: 16 U/L (ref 0–44)
AST: 29 U/L (ref 15–41)
Albumin: 3.8 g/dL (ref 3.5–5.0)
Alkaline Phosphatase: 110 U/L (ref 38–126)
Anion gap: 11 (ref 5–15)
BUN: 9 mg/dL (ref 6–20)
CO2: 20 mmol/L — ABNORMAL LOW (ref 22–32)
Calcium: 8.8 mg/dL — ABNORMAL LOW (ref 8.9–10.3)
Chloride: 106 mmol/L (ref 98–111)
Creatinine, Ser: 0.82 mg/dL (ref 0.44–1.00)
GFR calc Af Amer: >60 mL/min (ref >60)
GFR calc non Af Amer: >60 mL/min (ref >60)
Glucose, Bld: 112 mg/dL — ABNORMAL HIGH (ref 70–99)
Potassium: 3.8 mmol/L (ref 3.5–5.1)
Sodium: 137 mmol/L (ref 135–145)
Total Bilirubin: 0.4 mg/dL (ref 0.3–1.2)
Total Protein: 7.1 g/dL (ref 6.5–8.1)

## 2018-11-17 LAB — ETHANOL: Alcohol, Ethyl (B): 139 mg/dL — ABNORMAL HIGH (ref <10)

## 2018-11-17 LAB — SALICYLATE LEVEL: Salicylate Lvl: <7 mg/dL (ref 2.8–30.0)

## 2018-11-17 LAB — ACETAMINOPHEN LEVEL: Acetaminophen (Tylenol), Serum: <10 ug/mL — ABNORMAL LOW (ref 10–30)

## 2018-11-17 NOTE — ED Triage Notes (Signed)
Pt brought to ED by friends, pt had heavy etoh tonight, nausea no emesis, per friend pt had anxiety attack then began having what appear to be syncopal episodes. Pt does answer questions appropriately.  Pt placed on triage stretcher, 02 sat during episodes 100%

## 2018-11-17 NOTE — ED Notes (Signed)
Patient states she thinks she is just going to leave. Patient friend is calling a cab so they can get home safely.

## 2019-03-06 ENCOUNTER — Ambulatory Visit (HOSPITAL_COMMUNITY)
Admission: EM | Admit: 2019-03-06 | Discharge: 2019-03-06 | Disposition: A | Discharge status: Home / Self Care | Payer: BC Managed Care – PPO | Attending: Family Medicine | Admitting: Family Medicine

## 2019-03-06 ENCOUNTER — Encounter (HOSPITAL_COMMUNITY): Payer: Self-pay

## 2019-03-06 ENCOUNTER — Other Ambulatory Visit: Payer: Self-pay

## 2019-03-06 DIAGNOSIS — J029 Acute pharyngitis, unspecified: Secondary | ICD-10-CM | POA: Diagnosis not present

## 2019-03-06 DIAGNOSIS — R05 Cough: Secondary | ICD-10-CM | POA: Diagnosis not present

## 2019-03-06 DIAGNOSIS — Z20822 Contact with and (suspected) exposure to covid-19: Secondary | ICD-10-CM | POA: Insufficient documentation

## 2019-03-06 DIAGNOSIS — J069 Acute upper respiratory infection, unspecified: Secondary | ICD-10-CM | POA: Insufficient documentation

## 2019-03-06 DIAGNOSIS — J22 Unspecified acute lower respiratory infection: Secondary | ICD-10-CM | POA: Diagnosis present

## 2019-03-06 LAB — POCT RAPID STREP A: Streptococcus, Group A Screen (Direct): NEGATIVE

## 2019-03-06 MED ORDER — AZITHROMYCIN 250 MG PO TABS: 250.0000 mg | ORAL_TABLET | Freq: Every day | ORAL | 0 refills | Status: AC

## 2019-03-06 MED ORDER — BENZONATATE 200 MG PO CAPS: 200.0000 mg | ORAL_CAPSULE | Freq: Two times a day (BID) | ORAL | 0 refills | Status: AC | PRN

## 2019-03-06 MED ORDER — PREDNISONE 20 MG PO TABS: 20.0000 mg | ORAL_TABLET | Freq: Two times a day (BID) | ORAL | 0 refills | Status: AC

## 2019-03-06 MED ORDER — DM-GUAIFENESIN ER 30-600 MG PO TB12: 1.0000 | ORAL_TABLET | Freq: Two times a day (BID) | ORAL | 0 refills | Status: AC

## 2019-03-06 MED ORDER — AZITHROMYCIN 250 MG PO TABS: 250.0000 mg | ORAL_TABLET | Freq: Every day | ORAL | 0 refills | Status: DC

## 2019-03-06 NOTE — Discharge Instructions (Signed)
Go home to rest Drink plenty of fluids Take Tylenol for pain or fever Take the tessalon and the mucinex DM every 12 hours Take z pack and prednisone as prescribed You must quarantine at home until your test result is available You can check for your test result in MyChart

## 2019-03-06 NOTE — ED Provider Notes (Signed)
[facility]    CSN: [id number] Arrival date & time: 03/06/19  1605      History   Chief Complaint Chief Complaint  Patient presents with  . Cough  . Sore Throat  . Headache    HPI Brittney Boyle is a 24 y.o. female.   HPI  Patient states she is had a cough for a month.  She has been Covid tested and it was negative.  She continues to cough, she does feel somewhat short of breath.  The cough is keeping her awake at night.  She is very tired.  Her chest hurts from the coughing.  Her chest hurts with a deep breath.  Her cough is nonproductive.  She is a smoker.  She is trying to quit.  She does not have any underlying lung disease such as asthma.  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 In addition for the last 3 days she has sore throat and headache.  These are new.  She is here for another coronavirus test since she is developing new symptoms. She is tried a number of over-the-counter products for her cough.  Nothing seems to be helping. She has a couple of roommates.  Neither of them have been sick  Past Medical History:  Diagnosis Date  . Anemia   . Depression   . GERD (gastroesophageal reflux disease)   . History of kidney stones    H/O  . IBS (irritable bowel syndrome)   . Kidney stone   . Migraines     Patient Active Problem List   Diagnosis Date Noted  . Anxiety and depression 03/13/2017  . Factor V Leiden ([place]) 03/13/2017  . Migraine without aura and without status migrainosus, not intractable 03/13/2017  . Sleep disturbance 03/13/2017    Past Surgical History:  Procedure Laterality Date  . CHOLECYSTECTOMY N/A 08/15/2017   Procedure: LAPAROSCOPIC CHOLECYSTECTOMY;  Surgeon: Vickie Epley, MD;  Location: [facility] ORS;  Service: General;  Laterality: N/A;  . TONSILLECTOMY    . WISDOM TOOTH EXTRACTION      OB History   No obstetric history on file.      Home Medications    Prior to Admission medications     Medication Sig Start Date End Date Taking? Authorizing Provider  ASHWAGANDHA PO Take 2 tablets by mouth daily.    [provider]  aspirin-acetaminophen-caffeine (EXCEDRIN MIGRAINE) [phone] MG tablet Take 1 tablet by mouth daily as needed for migraine.    [provider]  azithromycin (ZITHROMAX) 250 MG tablet Take 1 tablet (250 mg total) by mouth daily. Take first 2 tablets together, then 1 every day until finished. 03/06/19   Raylene Everts, MD  benzonatate (TESSALON) 200 MG capsule Take 1 capsule (200 mg total) by mouth 2 (two) times daily as needed for cough. 03/06/19   Raylene Everts, MD  dextromethorphan-guaiFENesin [facility] DM) 30-600 MG 12hr tablet Take 1 tablet by mouth 2 (two) times daily. 03/06/19   Raylene Everts, MD  ibuprofen (ADVIL) 600 MG tablet Take 1 tablet (600 mg total) by mouth every 6 (six) hours as needed. 10/18/18   Mesner, Corene Cornea, MD  Levonorgestrel-Ethinyl Estradiol (AMETHIA,CAMRESE) 0.1-0.02 & 0.01 MG tablet Take 1 tablet by mouth daily. 08/15/17   [provider]  Multiple Vitamin (MULTIVITAMIN WITH MINERALS) TABS tablet Take 1 tablet by mouth daily.    [provider]  predniSONE (DELTASONE) 20 MG tablet Take 1 tablet (20  mg total) by mouth 2 (two) times daily with a meal. 03/06/19   Eustace Moore, MD  Probiotic Product ([employer] ADV PROBIOTIC GUMMIES) CHEW Chew 2 each by mouth daily.    [provider]  venlafaxine XR (EFFEXOR-XR) 150 MG 24 hr capsule Take 300 mg by mouth daily with breakfast.  03/14/17 10/18/18  [provider]    Family History Family History  Problem Relation Age of Onset  . Endometrial cancer Maternal Grandmother   . Thyroid cancer Paternal Grandmother   . Breast cancer Other     Social History Social History   Tobacco Use  . Smoking status: Current Every Day Smoker    Types: E-cigarettes  . Smokeless tobacco: Never Used  Substance Use Topics  . Alcohol use: Yes  . Drug use:  Never     Allergies   Sulfa antibiotics and Shellfish allergy   Review of Systems Review of Systems  Constitutional: Positive for fatigue. Negative for chills and fever.  HENT: Positive for sore throat. Negative for congestion and hearing loss.   Eyes: Negative for pain.  Respiratory: Positive for cough and shortness of breath. Negative for wheezing.   Cardiovascular: Negative for chest pain and leg swelling.  Gastrointestinal: Negative for abdominal pain, diarrhea, nausea and vomiting.  Musculoskeletal: Negative for myalgias.  Neurological: Positive for headaches. Negative for dizziness and seizures.  Psychiatric/Behavioral: Positive for sleep disturbance.     Physical Exam Triage Vital Signs ED Triage Vitals [03/06/19 1805]  Enc Vitals Group     BP 120/74     Pulse Rate 87     Resp 17     Temp 98.2 F (36.8 C)     Temp Source Oral     SpO2 100 %     Weight      Height      Head Circumference      Peak Flow      Pain Score 6     Pain Loc      Pain Edu?      Excl. in GC?    No data found.  Updated Vital Signs BP 120/74 (BP Location: Right Arm)   Pulse 87   Temp 98.2 F (36.8 C) (Oral)   Resp 17   LMP 02/26/2019   SpO2 100%      Physical Exam Constitutional:      General: She is not in acute distress.    Appearance: She is well-developed. She is ill-appearing.     Comments: Appears tired  HENT:     Head: Normocephalic and atraumatic.     Nose: No congestion.     Mouth/Throat:     Pharynx: Uvula midline. No posterior oropharyngeal erythema.     Comments: Mild erythema.  No tonsils no exudate Eyes:     Conjunctiva/sclera: Conjunctivae normal.     Pupils: Pupils are equal, round, and reactive to light.  Cardiovascular:     Rate and Rhythm: Normal rate and regular rhythm.  Pulmonary:     Effort: Pulmonary effort is normal. No respiratory distress.     Breath sounds: Normal breath sounds. No wheezing, rhonchi or rales.  Abdominal:     General: There  is no distension.     Palpations: Abdomen is soft.  Musculoskeletal:        General: Normal range of motion.     Cervical back: Normal range of motion.  Lymphadenopathy:     Cervical: No cervical adenopathy.  Skin:    General:  Skin is warm and dry.  Neurological:     Mental Status: She is alert.  Psychiatric:        Mood and Affect: Mood normal.        Behavior: Behavior normal.      UC Treatments / Results  Labs (all labs ordered are listed, but only abnormal results are displayed) Labs Reviewed  NOVEL CORONAVIRUS, NAA (HOSP ORDER, SEND-OUT TO REF LAB; TAT 18-24 HRS)  CULTURE, GROUP A STREP [facility])  POCT RAPID STREP A  Rapid strep is negative.  Culture sent to laboratory  EKG   Radiology No results found.  Procedures Procedures (including critical care time)  Medications Ordered in UC Medications - No data to display  Initial Impression / Assessment and Plan / UC Course  I have reviewed the triage vital signs and the nursing notes.  Pertinent labs & imaging results that were available during my care of the patient were reviewed by me and considered in my medical decision making (see chart for details).  Clinical Course as of Mar 05 1901  Thu Mar 06, 2019  1807 POCT Rapid Strep A [YN]    Clinical Course User Index [YN] Eustace Moore, MD    Reviewed with the patient that she likely had a viral bronchitis with some post bronchial inflammation that was causing her persistent cough.  Because she been coughing for more than 4 weeks I am going to cover her with an antibiotic, steroids, and some stronger cough medication.  She will let us know if not improving by next week Final Clinical Impressions(s) / UC Diagnoses   Final diagnoses:  Viral URI with cough  Contact with and (suspected) exposure to covid-19  LRTI (lower respiratory tract infection)     Discharge Instructions     Go home to rest Drink plenty of fluids Take Tylenol for pain or fever Take  the tessalon and the mucinex DM every 12 hours Take z pack and prednisone as prescribed You must quarantine at home until your test result is available You can check for your test result in MyChart    ED Prescriptions    Medication Sig Dispense Auth. Provider   benzonatate (TESSALON) 200 MG capsule Take 1 capsule (200 mg total) by mouth 2 (two) times daily as needed for cough. 20 capsule Eustace Moore, MD   dextromethorphan-guaiFENesin [facility] DM) 30-600 MG 12hr tablet Take 1 tablet by mouth 2 (two) times daily. 20 tablet Eustace Moore, MD   predniSONE (DELTASONE) 20 MG tablet Take 1 tablet (20 mg total) by mouth 2 (two) times daily with a meal. 10 tablet Eustace Moore, MD   azithromycin (ZITHROMAX) 250 MG tablet Take 1 tablet (250 mg total) by mouth daily. Take first 2 tablets together, then 1 every day until finished. 6 tablet Eustace Moore, MD     PDMP not reviewed this encounter.   Eustace Moore, MD 03/06/19 Nicholos Johns

## 2019-03-06 NOTE — ED Triage Notes (Signed)
Pt presents with non productive cough X 1 month that is causing chest discomfort and tightness, sore throat and headache X 3 days.

## 2019-03-08 LAB — NOVEL CORONAVIRUS, NAA (HOSP ORDER, SEND-OUT TO REF LAB; TAT 18-24 HRS): SARS-CoV-2, NAA: NOT DETECTED

## 2019-03-10 LAB — CULTURE, GROUP A STREP (THRC)

## 2020-04-01 IMAGING — CT CT ABD-PELV W/ CM
2 of 4 series · 16 of 46 positions shown, 18 images · IV contrast (iopamidol)
Comparison: None

CLINICAL DATA: Sudden onset of RIGHT lower quadrant pain last night
at midnight radiating to lower back, history of cholecystectomy,
GERD, irritable bowel syndrome

EXAM:
CT ABDOMEN AND PELVIS WITH CONTRAST
TECHNIQUE: Multidetector CT imaging of the abdomen and pelvis was performed
using the standard protocol following bolus administration of
intravenous contrast. Sagittal and coronal MPR images reconstructed
from axial data set.
CONTRAST:  100mL KSAHE8-V11 IOPAMIDOL (KSAHE8-V11) INJECTION 61% IV.
No oral contrast administered.

[Series 2: axial st · axial · 0.87mm/px · z∈[+309,+714]mm · 13 of 89 slices shown, 15 images]
[im 4/89  soft-tissue]
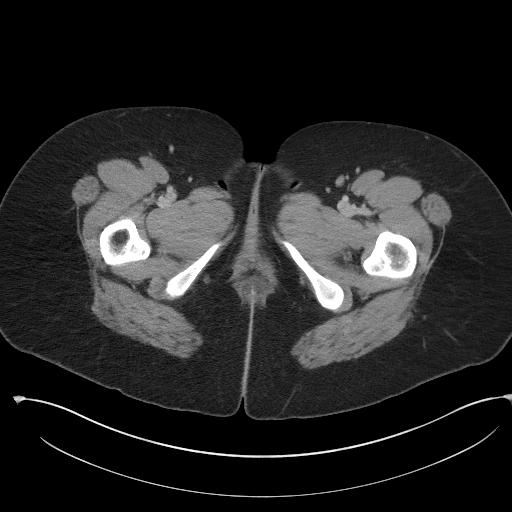
[im 4/89  bone]
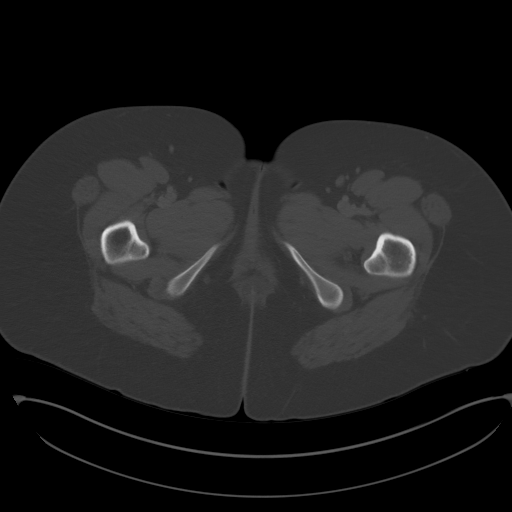
[im 11/89  soft-tissue]
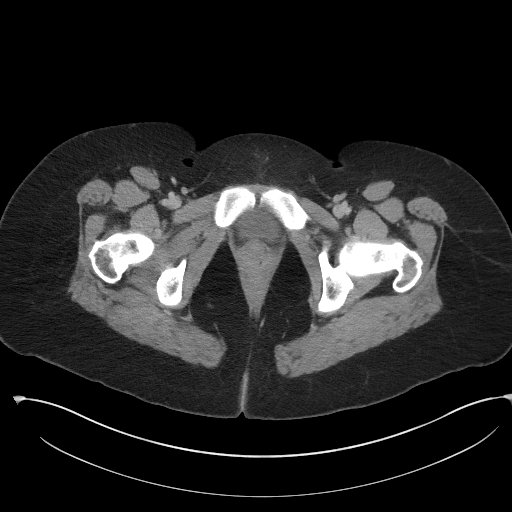
[im 17/89  soft-tissue]
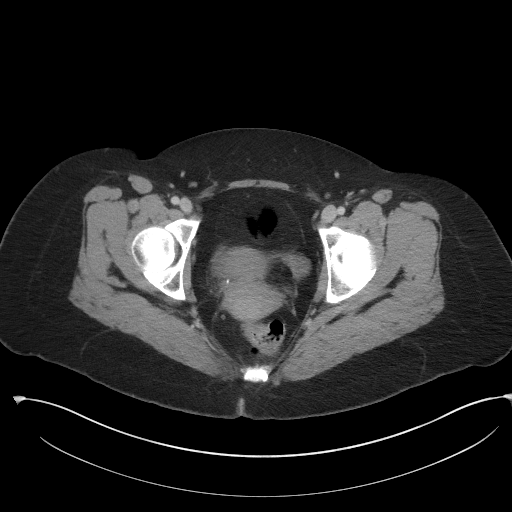
[im 24/89  soft-tissue]
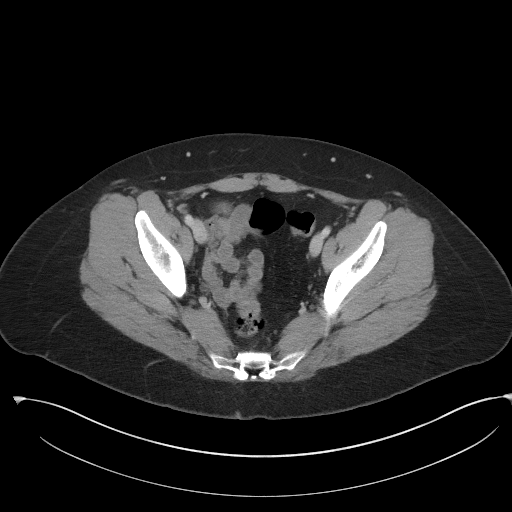
[im 31/89  soft-tissue]
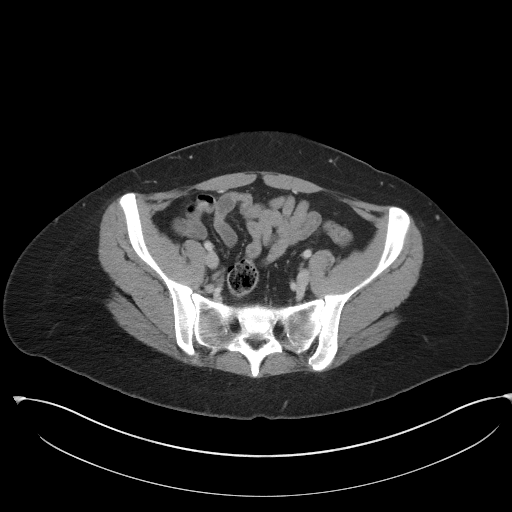
[im 38/89  soft-tissue]
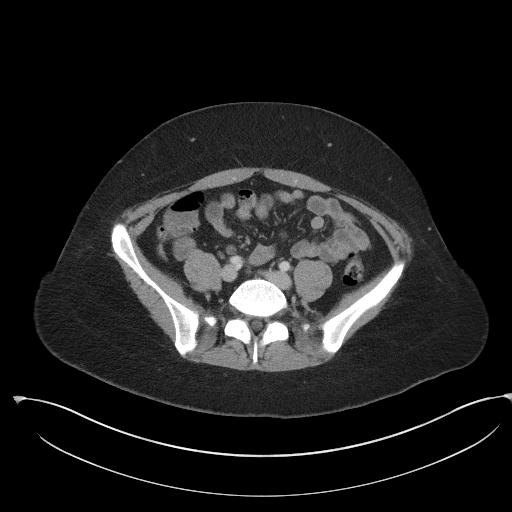
[im 45/89  soft-tissue]
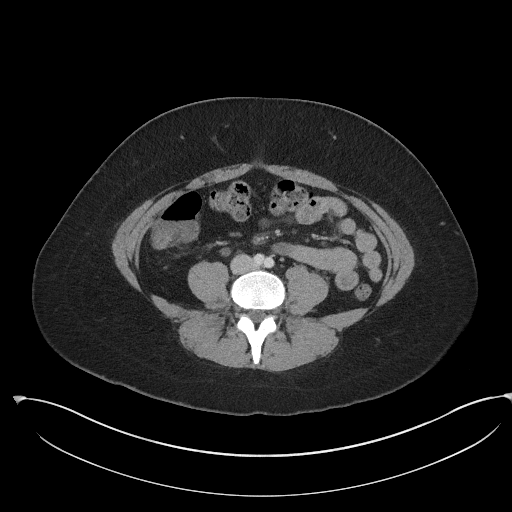
[im 51/89  soft-tissue]
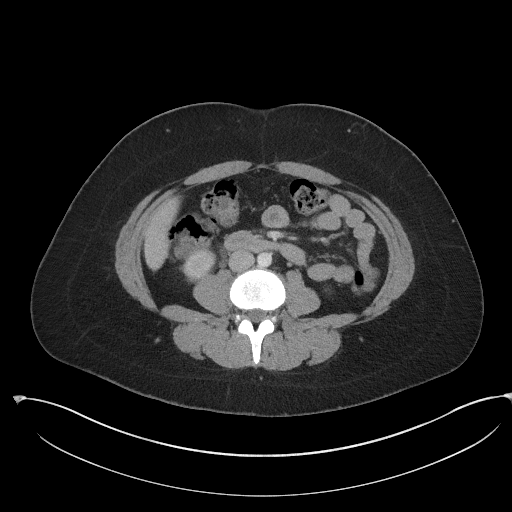
[im 58/89  soft-tissue]
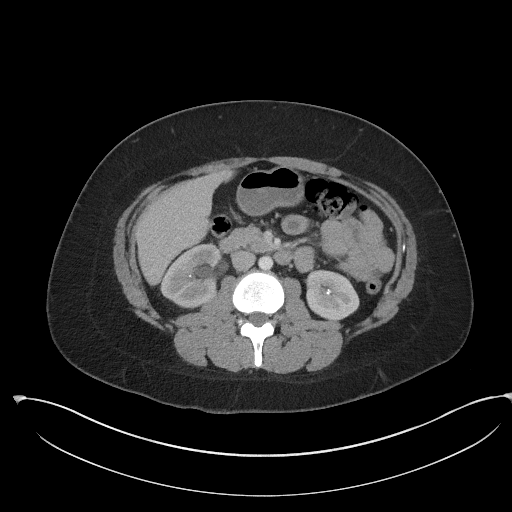
[im 58/89  bone]
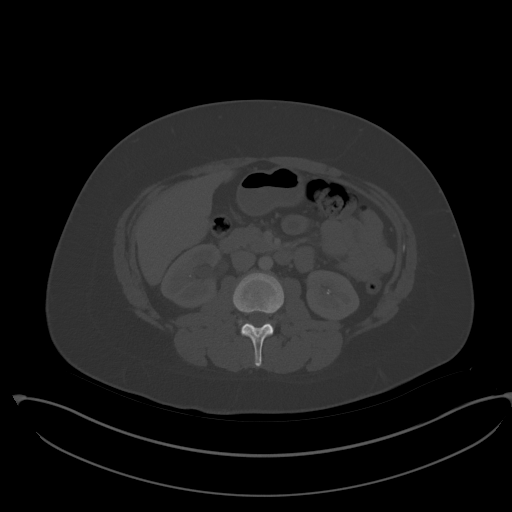
[im 65/89  soft-tissue]
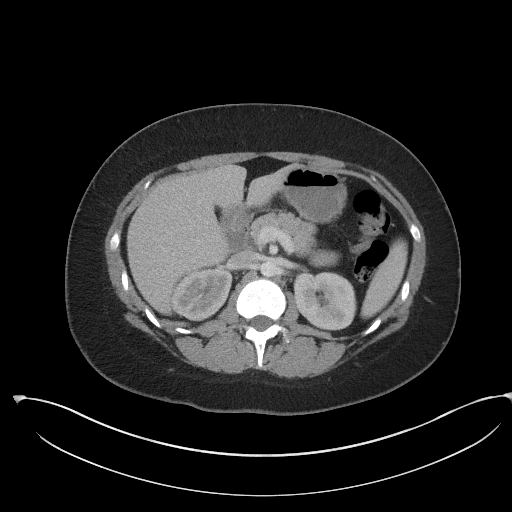
[im 72/89  soft-tissue]
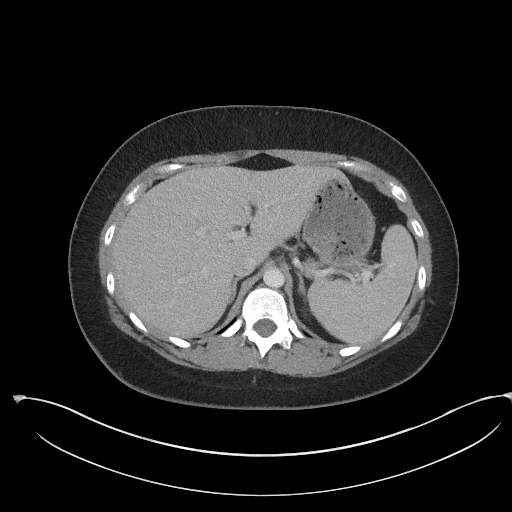
[im 78/89  soft-tissue]
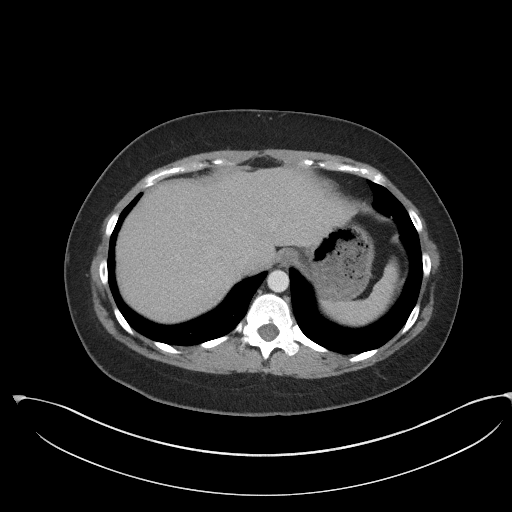
[im 85/89  soft-tissue]
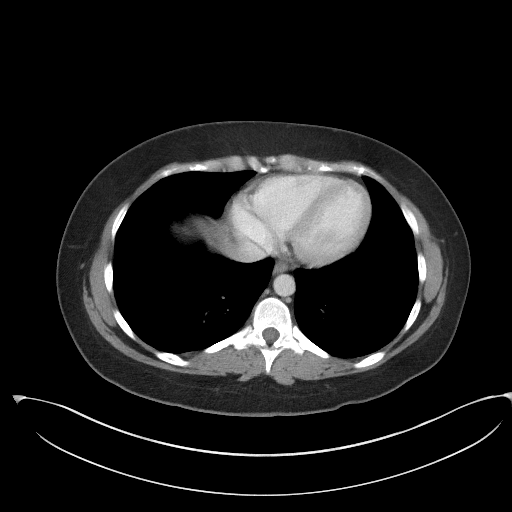

[Series 5: coronal st · coronal · 0.82mm/px · 3 of 88 slices shown]
[im 30/88  soft-tissue]
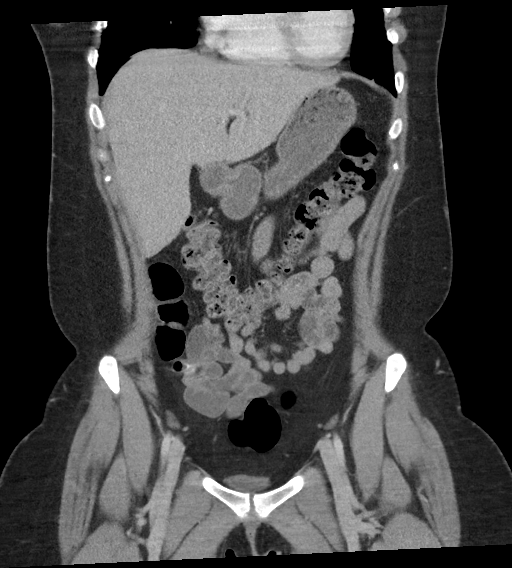
[im 39/88  soft-tissue]
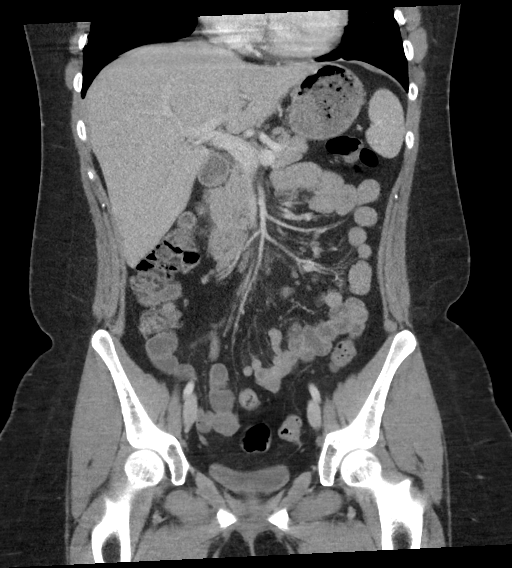
[im 49/88  soft-tissue]
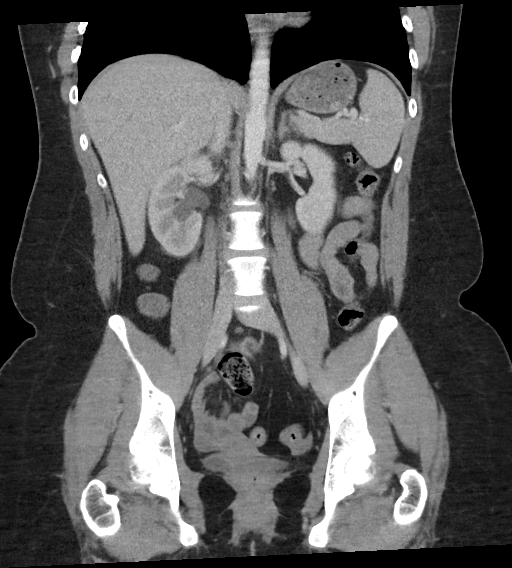

[16 of 46 positions shown; findings below may reference images not displayed]

FINDINGS: Lower chest: Minimal dependent atelectasis RIGHT lung base

Hepatobiliary: Gallbladder surgically absent.  Liver unremarkable.

Pancreas: Normal appearance

Spleen: Normal appearance

Adrenals/Urinary Tract: Adrenal glands normal appearance. Tiny
BILATERAL nonobstructing renal calculi. Delay in RIGHT nephrogram.
RIGHT hydronephrosis and hydroureter secondary to a 2 mm distal
RIGHT ureteral calculus very near the ureterovesical junction. No
LEFT hydronephrosis or hydroureter. Bladder unremarkable.

Stomach/Bowel: Normal appendix. Stomach and bowel loops normal
appearance.

Vascular/Lymphatic: Vascular structures patent on nondedicated exam.
Aorta normal caliber. No adenopathy.

Reproductive: Unremarkable uterus and adnexa

Other: No free air or free fluid.  No hernia.

Musculoskeletal: Bones unremarkable.
IMPRESSION: RIGHT hydronephrosis, hydroureter and delay in RIGHT nephrogram
secondary to a 2 mm distal RIGHT ureteral calculus very near the
ureterovesical junction.

Additional BILATERAL nonobstructing renal calculi.

## 2020-08-20 ENCOUNTER — Other Ambulatory Visit: Payer: Self-pay

## 2020-08-20 ENCOUNTER — Emergency Department (HOSPITAL_COMMUNITY): Payer: BC Managed Care – PPO

## 2020-08-20 ENCOUNTER — Emergency Department (HOSPITAL_COMMUNITY)
Admission: EM | Admit: 2020-08-20 | Discharge: 2020-08-20 | Disposition: A | Discharge status: Home / Self Care | Payer: BC Managed Care – PPO | Attending: Emergency Medicine | Admitting: Emergency Medicine

## 2020-08-20 ENCOUNTER — Encounter (HOSPITAL_COMMUNITY): Payer: Self-pay

## 2020-08-20 DIAGNOSIS — R0789 Other chest pain: Secondary | ICD-10-CM | POA: Insufficient documentation

## 2020-08-20 DIAGNOSIS — J029 Acute pharyngitis, unspecified: Secondary | ICD-10-CM | POA: Diagnosis not present

## 2020-08-20 DIAGNOSIS — F17290 Nicotine dependence, other tobacco product, uncomplicated: Secondary | ICD-10-CM | POA: Diagnosis not present

## 2020-08-20 LAB — BASIC METABOLIC PANEL
Anion gap: 8 (ref 5–15)
BUN: 8 mg/dL (ref 6–20)
CO2: 22 mmol/L (ref 22–32)
Calcium: 9.2 mg/dL (ref 8.9–10.3)
Chloride: 105 mmol/L (ref 98–111)
Creatinine, Ser: 0.71 mg/dL (ref 0.44–1.00)
GFR, Estimated: >60 mL/min (ref >60)
Glucose, Bld: 92 mg/dL (ref 70–99)
Potassium: 3.8 mmol/L (ref 3.5–5.1)
Sodium: 135 mmol/L (ref 135–145)

## 2020-08-20 LAB — I-STAT BETA HCG BLOOD, ED (MC, WL, AP ONLY): I-stat hCG, quantitative: <5 m[IU]/mL (ref <5)

## 2020-08-20 LAB — CBC
HCT: 41.2 % (ref 36.0–46.0)
Hemoglobin: 13.1 g/dL (ref 12.0–15.0)
MCH: 27.9 pg (ref 26.0–34.0)
MCHC: 31.8 g/dL (ref 30.0–36.0)
MCV: 87.8 fL (ref 80.0–100.0)
Platelets: 301 10*3/uL (ref 150–400)
RBC: 4.69 MIL/uL (ref 3.87–5.11)
RDW: 14.3 % (ref 11.5–15.5)
WBC: 5.9 10*3/uL (ref 4.0–10.5)
nRBC: 0 % (ref 0.0–0.2)

## 2020-08-20 LAB — TROPONIN I (HIGH SENSITIVITY): Troponin I (High Sensitivity): <2 ng/L (ref <18)

## 2020-08-20 MED ORDER — MELOXICAM 15 MG PO TABS: 15.0000 mg | ORAL_TABLET | Freq: Every day | ORAL | 0 refills | Status: AC

## 2020-08-20 MED ORDER — KETOROLAC TROMETHAMINE 15 MG/ML IJ SOLN
30.0000 mg | Freq: Once | INTRAMUSCULAR | Status: AC
  Administered 2020-08-20: 30 mg via INTRAMUSCULAR
  Filled 2020-08-20: qty 2

## 2020-08-20 NOTE — Discharge Instructions (Signed)
Please read and follow all provided instructions.  Your diagnoses today include:  1. Chest tightness     Tests performed today include: An EKG of your heart A chest x-ray Cardiac enzymes - a blood test for heart muscle damage Blood counts and electrolytes Vital signs. See below for your results today.   Medications prescribed:  Meloxicam - anti-inflammatory pain medication  You have been prescribed an anti-inflammatory medication or NSAID. Take with food. Do not take aspirin, ibuprofen, or naproxen if taking this medication. Take smallest effective dose for the shortest duration needed for your pain. Stop taking if you experience stomach pain or vomiting.   Take any prescribed medications only as directed.  Follow-up instructions: Please follow-up with your primary care provider as soon as you can for further evaluation of your symptoms.   Return instructions:  SEEK IMMEDIATE MEDICAL ATTENTION IF: You have severe chest pain, especially if the pain is crushing or pressure-like and spreads to the arms, back, neck, or jaw, or if you have sweating, nausea (feeling sick to your stomach), or shortness of breath. THIS IS AN EMERGENCY. Don't wait to see if the pain will go away. Get medical help at once. Call 911 or [phone] (operator). DO NOT drive yourself to the hospital.  Your chest pain gets worse and does not go away with rest.  You have an attack of chest pain lasting longer than usual, despite rest and treatment with the medications your caregiver has prescribed.  You wake from sleep with chest pain or shortness of breath. You feel dizzy or faint. You have chest pain not typical of your usual pain for which you originally saw your caregiver.  You have any other emergent concerns regarding your health.  Additional Information: Chest pain comes from many different causes. Your caregiver has diagnosed you as having chest pain that is not specific for one problem, but does not require  admission.  You are at low risk for an acute heart condition or other serious illness.   Your vital signs today were: BP 119/75 (BP Location: Right Arm)   Pulse 80   Temp 98 F (36.7 C) (Oral)   Resp 20   LMP 06/20/2020   SpO2 100%  If your blood pressure (BP) was elevated above 135/85 this visit, please have this repeated by your doctor within one month. --------------

## 2020-08-20 NOTE — ED Triage Notes (Signed)
Pt arrived via POV, c/o chest pain that started yesterday while at work. Mid sternal chest pain, non radiating, non reproducible. Denies any know sick contacts.

## 2020-08-20 NOTE — ED Provider Notes (Signed)
[facility]-EMERGENCY DEPT Provider Note   CSN: [id number] Arrival date & time: 08/20/20  9935     History Chief Complaint  Patient presents with   Chest Pain    Brittney Boyle is a 25 y.o. female.  Patient presents the emergency department for evaluation of chest tightness and pressure that started last night.  She has a history of tobacco use, OCP use, factor V Leiden.  Father had the same with history of blood clots.  Patient states that the tightness is worse when she takes a deep breath and is more significant when she tries to lie flat.  It is nonexertional and does not radiate.  She states that she did not feel well and had a mild viral-like syndrome about 5 days ago.  Yesterday she had a sore throat as well and tested herself for COVID.  She states that the home test was negative.  No fevers or chills.  Onset of symptoms gradual.  Patient denies other risk factors for pulmonary embolism including: unilateral leg swelling, history of DVT/PE/other blood clots, recent immobilizations, recent surgery, recent travel (>4hr segment), malignancy, hemoptysis.          Past Medical History:  Diagnosis Date   Anemia    Depression    GERD (gastroesophageal reflux disease)    History of kidney stones    H/O   IBS (irritable bowel syndrome)    Kidney stone    Migraines     Patient Active Problem List   Diagnosis Date Noted   Anxiety and depression 03/13/2017   Factor V Leiden (HCC) 03/13/2017   Migraine without aura and without status migrainosus, not intractable 03/13/2017   Sleep disturbance 03/13/2017    Past Surgical History:  Procedure Laterality Date   CHOLECYSTECTOMY N/A 08/15/2017   Procedure: LAPAROSCOPIC CHOLECYSTECTOMY;  Surgeon: Ancil Linsey, MD;  Location: [facility] ORS;  Service: General;  Laterality: N/A;   TONSILLECTOMY     WISDOM TOOTH EXTRACTION       OB History   No obstetric history on file.     Family History  Problem  Relation Age of Onset   Endometrial cancer Maternal Grandmother    Thyroid cancer Paternal Grandmother    Breast cancer Other     Social History   Tobacco Use   Smoking status: Every Day    Pack years: 0.00    Types: E-cigarettes   Smokeless tobacco: Never  Vaping Use   Vaping Use: Every day   Substances: Nicotine  Substance Use Topics   Alcohol use: Yes   Drug use: Never    Home Medications Prior to Admission medications   Medication Sig Start Date End Date Taking? Authorizing Provider  ASHWAGANDHA PO Take 2 tablets by mouth daily.    [provider]  aspirin-acetaminophen-caffeine (EXCEDRIN MIGRAINE) [phone] MG tablet Take 1 tablet by mouth daily as needed for migraine.    [provider]  azithromycin (ZITHROMAX) 250 MG tablet Take 1 tablet (250 mg total) by mouth daily. Take first 2 tablets together, then 1 every day until finished. 03/06/19   Eustace Moore, MD  benzonatate (TESSALON) 200 MG capsule Take 1 capsule (200 mg total) by mouth 2 (two) times daily as needed for cough. 03/06/19   Eustace Moore, MD  dextromethorphan-guaiFENesin [facility] DM) 30-600 MG 12hr tablet Take 1 tablet by mouth 2 (two) times daily. 03/06/19   Eustace Moore, MD  ibuprofen (ADVIL) 600 MG tablet Take 1 tablet (600 mg  total) by mouth every 6 (six) hours as needed. 10/18/18   Mesner, Barbara CowerJason, MD  Levonorgestrel-Ethinyl Estradiol (AMETHIA,CAMRESE) 0.1-0.02 & 0.01 MG tablet Take 1 tablet by mouth daily. 08/15/17   [provider]  Multiple Vitamin (MULTIVITAMIN WITH MINERALS) TABS tablet Take 1 tablet by mouth daily.    [provider]  predniSONE (DELTASONE) 20 MG tablet Take 1 tablet (20 mg total) by mouth 2 (two) times daily with a meal. 03/06/19   Eustace MooreNelson, Yvonne Sue, MD  Probiotic Product ([employer] ADV PROBIOTIC GUMMIES) CHEW Chew 2 each by mouth daily.    [provider]  venlafaxine XR (EFFEXOR-XR) 150 MG 24 hr capsule Take 300 mg by mouth daily  with breakfast.  03/14/17 10/18/18  [provider]    Allergies    Sulfa antibiotics and Shellfish allergy  Review of Systems   Review of Systems  Constitutional:  Negative for chills, diaphoresis and fever.  HENT:  Positive for sore throat. Negative for congestion.   Eyes:  Negative for redness.  Respiratory:  Positive for cough (Minimal). Negative for shortness of breath.   Cardiovascular:  Positive for chest pain (Heaviness). Negative for palpitations and leg swelling.  Gastrointestinal:  Negative for abdominal pain, nausea and vomiting.  Genitourinary:  Negative for dysuria.  Musculoskeletal:  Negative for back pain and neck pain.  Skin:  Negative for rash.  Neurological:  Negative for syncope and light-headedness.  Psychiatric/Behavioral:  The patient is not nervous/anxious.    Physical Exam Updated Vital Signs BP 119/75 (BP Location: Right Arm)   Pulse 80   Temp 98 F (36.7 C) (Oral)   Resp 20   LMP 06/20/2020   SpO2 100%   Physical Exam Vitals and nursing note reviewed.  Constitutional:      Appearance: She is well-developed. She is not diaphoretic.  HENT:     Head: Normocephalic and atraumatic.     Mouth/Throat:     Mouth: Mucous membranes are not dry.  Eyes:     Conjunctiva/sclera: Conjunctivae normal.  Neck:     Vascular: Normal carotid pulses. No JVD.     Trachea: Trachea normal. No tracheal deviation.  Cardiovascular:     Rate and Rhythm: Normal rate and regular rhythm.     Pulses: No decreased pulses.          Radial pulses are 2+ on the right side and 2+ on the left side.     Heart sounds: Normal heart sounds, S1 normal and S2 normal. No murmur heard. Pulmonary:     Effort: Pulmonary effort is normal. No respiratory distress.     Breath sounds: No wheezing.  Chest:     Chest wall: Tenderness (Mid chest.) present.     Comments: Patient winces and appears uncomfortable when I push over the mid chest.  She states that this reproduces her  symptoms. Abdominal:     General: Bowel sounds are normal.     Palpations: Abdomen is soft.     Tenderness: There is no abdominal tenderness. There is no guarding or rebound.  Musculoskeletal:        General: Normal range of motion.     Cervical back: Normal range of motion and neck supple. No muscular tenderness.  Skin:    General: Skin is warm and dry.     Coloration: Skin is not pale.  Neurological:     Mental Status: She is alert.    ED Results / Procedures / Treatments   Labs (all labs ordered  are listed, but only abnormal results are displayed) Labs Reviewed  BASIC METABOLIC PANEL  CBC  I-STAT BETA HCG BLOOD, ED ([facility], [facility], [facility] ONLY)  TROPONIN I (HIGH SENSITIVITY)    EKG EKG Interpretation  Date/Time:  Friday August 20 2020 10:19:45 EDT Ventricular Rate:  81 PR Interval:  98 QRS Duration: 72 QT Interval:  356 QTC Calculation: 413 R Axis:   99 Text Interpretation: Sinus rhythm with short PR Rightward axis Septal infarct , age undetermined Abnormal ECG Confirmed by Pricilla Loveless [phone]) on 08/20/2020 11:49:34 AM  Radiology DG Chest 2 View  Result Date: 08/20/2020 CLINICAL DATA:  Chest pain and cough for 2 days, initial encounter EXAM: CHEST - 2 VIEW COMPARISON:  None. FINDINGS: The heart size and mediastinal contours are within normal limits. Both lungs are clear. The visualized skeletal structures are unremarkable. IMPRESSION: No active cardiopulmonary disease. Electronically Signed   By: Alcide Clever M.D.   On: 08/20/2020 10:54    Procedures Procedures   Medications Ordered in ED Medications  ketorolac (TORADOL) 15 MG/ML injection 30 mg (has no administration in time range)    ED Course  I have reviewed the triage vital signs and the nursing notes.  Pertinent labs & imaging results that were available during my care of the patient were reviewed by me and considered in my medical decision making (see chart for details).  Patient seen and examined. Work-up  initiated.  EKG reviewed.  Vital signs reviewed and are as follows: BP 119/75 (BP Location: Right Arm)   Pulse 80   Temp 98 F (36.7 C) (Oral)   Resp 20   LMP 06/20/2020   SpO2 100%   Discussed lab work with patient at bedside which appears very reassuring.  We discussed risk factors for pulmonary embolism.  Currently she does not have any signs and symptoms of DVT.  She is not tachycardic or hypoxic.  The pain is reproducible on exam.  She does have risk factors of smoking, OCP use, factor V Leiden.  She has never had a clot before.  I had a shared decision making with the patient in regards to how we could proceed.  Discussed that her vital signs and overall exam are reassuring.  We discussed her risk factors for thromboembolism. The first option would be to treat her symptoms with anti-inflammatory medications and monitor closely.  She would need to return if she got worse as far as her breathing is concerned or had intractable pain or other symptoms.  The other option would be to send a D-dimer.  We discussed need for obtaining CT imaging of this were to be positive.  At this time, she would like something for her symptoms.  I have ordered IM Toradol.  She is comfortable with discharge to home at this time.  Discussed with patient that if her breathing became worse, she became more short of breath, or decided that she wanted further work-up --to return to the emergency department and we would take the next steps.  She verbalizes understanding agrees with plan.  She seems reliable to return if worsening.  We did talk about possible etiologies of her symptoms.  At this point I feel that chest wall pain or costochondritis is most likely.  Pericarditis is also possibility.  She may have pleurisy related to recent viral infection.  Doubt myocarditis given normal troponin and EKG without other concerning findings.  She does have right axis deviation, no old for comparison.  Less likely ACS, PE,  pneumonia, dissection, COVID.     MDM Rules/Calculators/A&P                          Patient here with chest tightness.  Discussion with patient as above.  Vital signs are very reassuring.  Her symptoms are readily reproducible on my exam.  Differential discussion as above.    Final Clinical Impression(s) / ED Diagnoses Final diagnoses:  Chest tightness    Rx / DC Orders ED Discharge Orders          Ordered    meloxicam (MOBIC) 15 MG tablet  Daily        08/20/20 1527             Renne Crigler, PA-C 08/20/20 1528    LongArlyss Repress, MD 08/20/20 2346

## 2020-08-20 NOTE — ED Provider Notes (Signed)
SomeEmergency Medicine Provider Triage Evaluation Note  Brittney Boyle , a 25 y.o. female  was evaluated in triage.  Pt complains of chest pain.  Patient is a 25 year old female with no past medical history apart from nephrolithiasis.  She complaining of chest pain that feels like severe pressure shortness of breath.  She describes it as pleuritic and not exertional.  She states it is red yesterday came on gradually over the course the day.  She denies any hemoptysis no recent travel she is on a estrogen containing OCP.  She denies any leg swelling unilateral or bilateral.  No cough fever chills nausea or vomiting no diarrhea.  No other associate symptoms.   Review of Systems  Positive: Chest pain Negative: Fever  Physical Exam  BP 128/87 (BP Location: Left Arm)   Pulse 83   Temp 98 F (36.7 C) (Oral)   Resp (!) 22   LMP 06/20/2020   SpO2 100%  Gen:   Awake,  uncomfortable appearing Resp:  Normal effort, mild tachypnea speaking in full sentences MSK:   Moves extremities without difficulty  Other:  Abdomen soft nontender no guarding or rebound.  Medical Decision Making  Medically screening exam initiated at 11:57 AM.  Appropriate orders placed.  [name] was informed that the remainder of the evaluation will be completed by another provider, this initial triage assessment does not replace that evaluation, and the importance of remaining in the ED until their evaluation is complete.  Patient is 25 year old female presented for chest pain shortness of breath.  Patient seen in waiting room.  Medical screening exam was completed.  Labs placed.  EKG normal sinus rhythm with some right axis deviation.  Patient understands importance of waiting in ER for entirety of ER work-up.   Solon Augusta [place], [place] 08/20/20 1159    Pricilla Loveless, MD 08/21/20 1525

## 2021-06-18 ENCOUNTER — Emergency Department (HOSPITAL_COMMUNITY)
Admission: EM | Admit: 2021-06-18 | Discharge: 2021-06-18 | Discharge status: Against Medical Advice | Payer: BC Managed Care – PPO | Source: Home / Self Care

## 2021-06-18 ENCOUNTER — Emergency Department: Payer: BC Managed Care – PPO

## 2021-06-18 ENCOUNTER — Emergency Department
Admission: EM | Admit: 2021-06-18 | Discharge: 2021-06-18 | Disposition: A | Discharge status: Home / Self Care | Payer: BC Managed Care – PPO | Attending: Emergency Medicine | Admitting: Emergency Medicine

## 2021-06-18 ENCOUNTER — Other Ambulatory Visit: Payer: Self-pay

## 2021-06-18 DIAGNOSIS — D72829 Elevated white blood cell count, unspecified: Secondary | ICD-10-CM | POA: Insufficient documentation

## 2021-06-18 DIAGNOSIS — N202 Calculus of kidney with calculus of ureter: Secondary | ICD-10-CM | POA: Insufficient documentation

## 2021-06-18 DIAGNOSIS — R1031 Right lower quadrant pain: Secondary | ICD-10-CM | POA: Diagnosis present

## 2021-06-18 DIAGNOSIS — Z20822 Contact with and (suspected) exposure to covid-19: Secondary | ICD-10-CM | POA: Diagnosis not present

## 2021-06-18 DIAGNOSIS — N200 Calculus of kidney: Secondary | ICD-10-CM

## 2021-06-18 LAB — RESP PANEL BY RT-PCR (FLU A&B, COVID) ARPGX2
Influenza A by PCR: NEGATIVE
Influenza B by PCR: NEGATIVE
SARS Coronavirus 2 by RT PCR: NEGATIVE

## 2021-06-18 LAB — POC URINE PREG, ED: Preg Test, Ur: NEGATIVE

## 2021-06-18 LAB — CBC
HCT: 40.4 % (ref 36.0–46.0)
Hemoglobin: 12.7 g/dL (ref 12.0–15.0)
MCH: 26.1 pg (ref 26.0–34.0)
MCHC: 31.4 g/dL (ref 30.0–36.0)
MCV: 83 fL (ref 80.0–100.0)
Platelets: 322 10*3/uL (ref 150–400)
RBC: 4.87 MIL/uL (ref 3.87–5.11)
RDW: 15 % (ref 11.5–15.5)
WBC: 11.4 10*3/uL — ABNORMAL HIGH (ref 4.0–10.5)
nRBC: 0 % (ref 0.0–0.2)

## 2021-06-18 LAB — URINALYSIS, ROUTINE W REFLEX MICROSCOPIC
Bacteria, UA: NONE SEEN
Bilirubin Urine: NEGATIVE
Glucose, UA: NEGATIVE mg/dL
Ketones, ur: NEGATIVE mg/dL
Nitrite: NEGATIVE
Protein, ur: NEGATIVE mg/dL
Specific Gravity, Urine: 1.01 (ref 1.005–1.030)
pH: 6 (ref 5.0–8.0)

## 2021-06-18 LAB — COMPREHENSIVE METABOLIC PANEL
ALT: 13 U/L (ref 0–44)
AST: 22 U/L (ref 15–41)
Albumin: 4.3 g/dL (ref 3.5–5.0)
Alkaline Phosphatase: 117 U/L (ref 38–126)
Anion gap: 9 (ref 5–15)
BUN: 13 mg/dL (ref 6–20)
CO2: 24 mmol/L (ref 22–32)
Calcium: 9.6 mg/dL (ref 8.9–10.3)
Chloride: 104 mmol/L (ref 98–111)
Creatinine, Ser: 0.84 mg/dL (ref 0.44–1.00)
GFR, Estimated: >60 mL/min (ref >60)
Glucose, Bld: 95 mg/dL (ref 70–99)
Potassium: 3.6 mmol/L (ref 3.5–5.1)
Sodium: 137 mmol/L (ref 135–145)
Total Bilirubin: 0.4 mg/dL (ref 0.3–1.2)
Total Protein: 8 g/dL (ref 6.5–8.1)

## 2021-06-18 LAB — LIPASE, BLOOD: Lipase: 44 U/L (ref 11–51)

## 2021-06-18 MED ORDER — ONDANSETRON HCL 4 MG/2ML IJ SOLN
4.0000 mg | Freq: Once | INTRAMUSCULAR | Status: AC
  Administered 2021-06-18: 4 mg via INTRAVENOUS
  Filled 2021-06-18: qty 2

## 2021-06-18 MED ORDER — SODIUM CHLORIDE 0.9 % IV SOLN
1.0000 g | Freq: Once | INTRAVENOUS | Status: AC
  Administered 2021-06-18: 1 g via INTRAVENOUS
  Filled 2021-06-18: qty 10

## 2021-06-18 MED ORDER — ACETAMINOPHEN 500 MG PO TABS
1000.0000 mg | ORAL_TABLET | Freq: Once | ORAL | Status: AC
  Administered 2021-06-18: 1000 mg via ORAL
  Filled 2021-06-18: qty 2

## 2021-06-18 MED ORDER — OXYCODONE-ACETAMINOPHEN 5-325 MG PO TABS: 1.0000 | ORAL_TABLET | Freq: Four times a day (QID) | ORAL | 0 refills | Status: DC | PRN

## 2021-06-18 MED ORDER — KETOROLAC TROMETHAMINE 10 MG PO TABS: 10.0000 mg | ORAL_TABLET | Freq: Four times a day (QID) | ORAL | 0 refills | Status: AC | PRN

## 2021-06-18 MED ORDER — IOHEXOL 300 MG/ML  SOLN
80.0000 mL | Freq: Once | INTRAMUSCULAR | Status: AC | PRN
  Administered 2021-06-18: 80 mL via INTRAVENOUS
  Filled 2021-06-18: qty 80

## 2021-06-18 MED ORDER — TAMSULOSIN HCL 0.4 MG PO CAPS: 0.4000 mg | ORAL_CAPSULE | Freq: Every day | ORAL | 0 refills | Status: AC

## 2021-06-18 MED ORDER — SODIUM CHLORIDE 0.9 % IV BOLUS
500.0000 mL | Freq: Once | INTRAVENOUS | Status: AC
  Administered 2021-06-18: 500 mL via INTRAVENOUS

## 2021-06-18 MED ORDER — ONDANSETRON 4 MG PO TBDP: 4.0000 mg | ORAL_TABLET | Freq: Three times a day (TID) | ORAL | 0 refills | Status: DC | PRN

## 2021-06-18 MED ORDER — KETOROLAC TROMETHAMINE 30 MG/ML IJ SOLN
30.0000 mg | Freq: Once | INTRAMUSCULAR | Status: AC
  Administered 2021-06-18: 30 mg via INTRAVENOUS
  Filled 2021-06-18: qty 1

## 2021-06-18 MED ORDER — MORPHINE SULFATE (PF) 4 MG/ML IV SOLN
4.0000 mg | Freq: Once | INTRAVENOUS | Status: AC
  Administered 2021-06-18: 4 mg via INTRAVENOUS
  Filled 2021-06-18: qty 1

## 2021-06-18 MED ORDER — CEPHALEXIN 500 MG PO CAPS: 500.0000 mg | ORAL_CAPSULE | Freq: Four times a day (QID) | ORAL | 0 refills | Status: AC

## 2021-06-18 MED ORDER — OXYCODONE-ACETAMINOPHEN 5-325 MG PO TABS
1.0000 | ORAL_TABLET | Freq: Once | ORAL | Status: AC
  Administered 2021-06-18: 1 via ORAL
  Filled 2021-06-18: qty 1

## 2021-06-18 NOTE — ED Notes (Signed)
Patient transported to CT 

## 2021-06-18 NOTE — ED Triage Notes (Signed)
Pt via POV from home. Pt c/o mid lower abd pain that started last night. Pt also endorses nausea. Denies diarrhea or fevers. Pt has a hx of cholecystectomy. Pt still has appendix. Pt is A&OX4 and NAD ?

## 2021-06-18 NOTE — ED Provider Notes (Signed)
? ?[facility] ?Provider Note ? ?Patient Contact: 7:17 PM (approximate) ? ? ?History  ? ?Abdominal Pain ? ? ?HPI ? ?Brittney Boyle is a 26 y.o. female who presents the emergency department complaining of diffuse right upper and right lower quadrant abdominal pain.  Pain is sharp, she is endorsing nausea and vomiting.  Denies diarrhea.  Patient does have a history of cholecystectomy.  She does have a history of pyelonephritis and nephrolithiasis.  She still has her appendix. ?  ? ? ?Physical Exam  ? ?Triage Vital Signs: ?ED Triage Vitals  ?Enc Vitals Group  ?   BP 06/18/21 1843 112/75  ?   Pulse Rate 06/18/21 1843 70  ?   Resp 06/18/21 1843 18  ?   Temp 06/18/21 1843 98.3 ?F (36.8 ?C)  ?   Temp Source 06/18/21 1843 Oral  ?   SpO2 06/18/21 1843 99 %  ?   Weight 06/18/21 1841 184 lb (83.5 kg)  ?   Height 06/18/21 1841 5\' 4"  (1.626 m)  ?   Head Circumference --   ?   Peak Flow --   ?   Pain Score 06/18/21 1841 10  ?   Pain Loc --   ?   Pain Edu? --   ?   Excl. in GC? --   ? ? ?Most recent vital signs: ?Vitals:  ? 06/18/21 2100 06/18/21 2130  ?BP: 102/70 112/77  ?Pulse: 64 60  ?Resp:  16  ?Temp:    ?SpO2:  100%  ? ? ? ?General: Alert and in no acute distress.  ?Cardiovascular:  Good peripheral perfusion ?Respiratory: Normal respiratory effort without tachypnea or retractions. Lungs CTAB.  ?Gastrointestinal: Bowel sounds ?4 quadrants.  Soft to palpation all quadrants.  Tender diffusely in the right abdomen.  No point specific tenderness.  No palpable abnormality.  No guarding or rigidity.  No palpable masses.  No CVA tenderness. ?Musculoskeletal: Full range of motion to all extremities.  ?Neurologic:  No gross focal neurologic deficits are appreciated.  ?Skin:   No rash noted ?Other: ? ? ?ED Results / Procedures / Treatments  ? ?Labs ?(all labs ordered are listed, but only abnormal results are displayed) ?Labs Reviewed  ?CBC - Abnormal; Notable for the following components:  ?    Result Value  ?  WBC 11.4 (*)   ? All other components within normal limits  ?URINALYSIS, ROUTINE W REFLEX MICROSCOPIC - Abnormal; Notable for the following components:  ? Color, Urine STRAW (*)   ? APPearance CLEAR (*)   ? Hgb urine dipstick SMALL (*)   ? Leukocytes,Ua TRACE (*)   ? All other components within normal limits  ?RESP PANEL BY RT-PCR (FLU A&B, COVID) [id number]  ?URINE CULTURE  ?LIPASE, BLOOD  ?COMPREHENSIVE METABOLIC PANEL  ?POC URINE PREG, ED  ? ? ? ?EKG ? ? ? ? ?RADIOLOGY ? ?I personally viewed and evaluated these images as part of my medical decision making, as well as reviewing the written report by the radiologist. ? ?ED Provider Interpretation: Evidence of nephrolithiasis to the left ureter measuring 6 mm.  Nephrolithiasis in the right renal pelvis measuring 4 mm.  No evidence of hydronephrosis or hydroureter at this time. ? ?CT ABDOMEN PELVIS W CONTRAST ? ?Result Date: 06/18/2021 ?CLINICAL DATA:  Right lower quadrant pain with nausea and vomiting for 2 days, initial encounter EXAM: CT ABDOMEN AND PELVIS WITH CONTRAST TECHNIQUE: Multidetector CT imaging of the abdomen and pelvis was performed using the standard protocol  following bolus administration of intravenous contrast. RADIATION DOSE REDUCTION: This exam was performed according to the departmental dose-optimization program which includes automated exposure control, adjustment of the mA and/or kV according to patient size and/or use of iterative reconstruction technique. CONTRAST:  70mL OMNIPAQUE IOHEXOL 300 MG/ML  SOLN COMPARISON:  10/24/2018 FINDINGS: Lower chest: No acute abnormality. Hepatobiliary: No focal liver abnormality is seen. Status post cholecystectomy. No biliary dilatation. Pancreas: Unremarkable. No pancreatic ductal dilatation or surrounding inflammatory changes. Spleen: Normal in size without focal abnormality. Adrenals/Urinary Tract: Adrenal glands are within normal limits. Kidneys are well visualized bilaterally. Renal calculi are noted  bilaterally. The bladder is well distended. The ureters show evidence of a 6 mm proximal left ureteral stone with very minimal fullness of the renal collecting system. No obstructive changes are noted on the right. Stomach/Bowel: No obstructive or inflammatory changes of the colon are seen. The appendix is within normal limits. Small bowel and stomach are unremarkable. Vascular/Lymphatic: No significant vascular findings are present. No enlarged abdominal or pelvic lymph nodes. Reproductive: Uterus and bilateral adnexa are unremarkable. Other: No abdominal wall hernia or abnormality. No abdominopelvic ascites. Musculoskeletal: No acute or significant osseous findings. IMPRESSION: 6 mm proximal left ureteral stone with very minimal fullness of the left renal collecting system. Nonobstructing renal calculi bilaterally measuring up to 4 mm on the right. Normal-appearing appendix. Electronically Signed   By: Alcide Clever M.D.   On: 06/18/2021 20:04   ? ?PROCEDURES: ? ?Critical Care performed: No ? ?Procedures ? ? ?MEDICATIONS ORDERED IN ED: ?Medications  ?cefTRIAXone (ROCEPHIN) 1 g in sodium chloride 0.9 % 100 mL IVPB (1 g Intravenous New Bag/Given 06/18/21 2133)  ?acetaminophen (TYLENOL) tablet 1,000 mg (1,000 mg Oral Given 06/18/21 2039)  ?iohexol (OMNIPAQUE) 300 MG/ML solution 80 mL (80 mLs Intravenous Contrast Given 06/18/21 1953)  ?ondansetron [facility]) injection 4 mg (4 mg Intravenous Given 06/18/21 2007)  ?morphine (PF) 4 MG/ML injection 4 mg (4 mg Intravenous Given 06/18/21 2050)  ?ketorolac (TORADOL) 30 MG/ML injection 30 mg (30 mg Intravenous Given 06/18/21 2133)  ?sodium chloride 0.9 % bolus 500 mL (500 mLs Intravenous New Bag/Given 06/18/21 2133)  ?oxyCODONE-acetaminophen (PERCOCET/ROXICET) 5-325 MG per tablet 1 tablet (1 tablet Oral Given 06/18/21 2133)  ? ? ? ?IMPRESSION / MDM / ASSESSMENT AND PLAN / ED COURSE  ?I reviewed the triage vital signs and the nursing notes. ?             ?               ? ?Differential  diagnosis includes, but is not limited to, nephrolithiasis, appendicitis, colitis, UTI, pyelonephritis ? ? ?Patient's diagnosis is consistent with nephrolithiasis.  Patient presented to the emergency department complaining of right-sided abdominal pain.  She does have a history of cholecystectomy and history of recurrent kidney stones.  Patient was tender in the right abdomen.  Labs and imaging were obtained.  Patient has findings consistent with nephrolithiasis on the left side.  No significant hydroureter or hydronephrosis to be concern for obstruction at this time.  Patient did have a few leukocytes, and a slight elevation in her white blood cell count.  She had no fever, no dysuria, polyuria.  At this time I will treat empirically with antibiotics to ensure coverage for UTI even though I feel this is unlikely.  She is nontoxic-appearing.  Does not require admission at this time.  Patient will be given symptom control medications to include pain medication, fluids, Toradol here in the emergency  department.  She will be discharged with narcotics, Flomax, Toradol tablets, antibiotics.  Return precautions to include fever, worsening pain, nausea vomiting to the point of being able to take medications are discussed.  If symptoms are persisting without significant improvement despite medication, recommend follow-up with urology..  Patient is given ED precautions to return to the ED for any worsening or new symptoms. ? ? ? ?  ? ? ?FINAL CLINICAL IMPRESSION(S) / ED DIAGNOSES  ? ?Final diagnoses:  ?Nephrolithiasis  ? ? ? ?Rx / DC Orders  ? ?ED Discharge Orders   ? ?      Ordered  ?  cephALEXin (KEFLEX) 500 MG capsule  4 times daily       ? 06/18/21 2131  ?  tamsulosin (FLOMAX) 0.4 MG CAPS capsule  Daily       ? 06/18/21 2131  ?  oxyCODONE-acetaminophen (PERCOCET/ROXICET) 5-325 MG tablet  Every 6 hours PRN       ? 06/18/21 2131  ?  ketorolac (TORADOL) 10 MG tablet  Every 6 hours PRN       ? 06/18/21 2131  ?  ondansetron  (ZOFRAN-ODT) 4 MG disintegrating tablet  Every 8 hours PRN       ? 06/18/21 2131  ? ?  ?  ? ?  ? ? ? ?Note:  This document was prepared using Conservation officer, historic buildings and may include unintentional di

## 2021-06-20 LAB — URINE CULTURE

## 2023-01-09 NOTE — Progress Notes (Signed)
 Assessment:     1. Sore throat   2. Upper respiratory tract infection, unspecified type       Plan:   1. Labs and orders from today's visit:  Patient's Medications  New Prescriptions   BENZONATATE  (TESSALON ) 200 MG CAPSULE    Take one capsule (200 mg dose) by mouth 3 (three) times a day as needed for Cough for up to 7 days.   CETIRIZINE (ZYRTEC) 10 MG TABLET    Take one tablet (10 mg dose) by mouth daily for 30 days.   MENTHOL-CETYLPYRIDINIUM (CEPACOL REGULAR STRENGTH) 3 MG LOZENGE    Take one lozenge (3 mg dose) by mouth as needed for Pain for up to 5 days.   PSEUDOEPHEDRINE-BROMPHENIRAMINE-DM (BROMFED-DM) 30-2-10 MG/5ML SYRUP    Take 5 mLs by mouth 3 (three) times a day as needed for up to 5 days.     Current Outpatient Medications  Medication Instructions  . benzonatate  (TESSALON ) 200 mg, Oral, 3 times a day as needed  . CEPACOL REGULAR STRENGTH 3 mg, Oral, As needed  . cetirizine (ZYRTEC) 10 mg, Oral, Daily  . pseudoephedrine-brompheniramine-DM (BROMFED-DM) 30-2-10 mg/48mL syrup 5 mLs, Oral, 3 times a day as needed      MEDICAL DECISION MAKING   Look for over-the-counter medications to relieve symptoms:  Acetaminophen  (Tylenol ) as needed for headaches, fever or body aches. Dextromethorphan (Robitussin) as needed for a cough. Pseudoephedrine (Sudafed) every 6 hours as needed for congestion for up to 5 days.  Oxymetazoline (Afrin) daily up to 3 days. This is a nasal decongestant. Do NOT exceed 3 days or you may have rebound congestion when you stop. For throat pain, benzocaine (Cepacol lozenges) or phenol throat spray (Chlorseptic) can be used as needed. If you can take ibuprofen , this can help reduce throat inflammation.   There are many combination cold and flu medications with these ingredients.   Do not take these medications of you are allergic or a provider instructed you to avoid them.   Drink plenty of fluids and rest.   Try to stay home if you are sick.  If you must leave the home while sick, I recommend masking and washing hands.  If you develop new fevers or have worsening symptoms, you may return to be seen. Go to the ER if you have severe symptoms such as severe weakness or severe shortness of breath.   Encounter Diagnoses  Name Primary?  . Sore throat Yes  . Upper respiratory tract infection, unspecified type      The diagnosis was discussed in detail with Brittney Boyle. We discussed other possible causes of their symptoms, treatment options, risks, and benefits. Brittney Boyle is in agreement with the management plan and all questions were answered.    Patient's Medications  New Prescriptions   BENZONATATE  (TESSALON ) 200 MG CAPSULE    Take one capsule (200 mg dose) by mouth 3 (three) times a day as needed for Cough for up to 7 days.   CETIRIZINE (ZYRTEC) 10 MG TABLET    Take one tablet (10 mg dose) by mouth daily for 30 days.   MENTHOL-CETYLPYRIDINIUM (CEPACOL REGULAR STRENGTH) 3 MG LOZENGE    Take one lozenge (3 mg dose) by mouth as needed for Pain for up to 5 days.   PSEUDOEPHEDRINE-BROMPHENIRAMINE-DM (BROMFED-DM) 30-2-10 MG/5ML SYRUP    Take 5 mLs by mouth 3 (three) times a day as needed for up to 5 days.        Brittney Boyle will continue all current prescription medication for  chronic conditions. We also discussed OTC medications to be used to include OTC Tylenol  PRN for pain and/or fever. If symptoms do not improve or worsen in the next 2 days, Brittney Boyle will follow up with PCP, our clinic, or go to the ED if worse.      Brittney Mace, NP    Subjective:    Brittney Boyle is a 27 y.o. female who presents for evaluation of influenza like symptoms. Symptoms are moderate and include chills, headache, myalgias, productive cough and fever and have been present for 1 day . Brittney Boyle has tried to alleviate the symptoms with acetaminophen  with minimal relief. High risk factors for influenza complications: none.  Associated symptoms  include congestion.  The patient denies constipation, dizziness, rash, diarrhea, dysuria, nausea and vomiting.   History reviewed. No pertinent past medical history.  Allergies  Allergen Reactions  . Sulfa Antibiotics Anaphylaxis     Social History   Socioeconomic History  . Marital status: Single  Tobacco Use  . Smoking status: Never  . Smokeless tobacco: Never  Vaping Use  . Vaping status: Every Day  . Substances: Nicotine     Objective:   BP 103/72 (BP Location: Left Upper Arm, Patient Position: Sitting)   Pulse 81   Temp 98.1 F (36.7 C) (Tympanic)   Resp 17   Ht 5' 4 (1.626 m)   Wt 241 lb (109.3 kg)   LMP 10/30/2022 (Approximate)   SpO2 95%   BMI 41.37 kg/m  General appearance: alert, appears stated age, cooperative and no distress Head: Normocephalic, without obvious abnormality, atraumatic Eyes: conjunctivae/corneas clear. PERRL,  Ears: normal TM's and external ear canals both ears Nose: Nares normal. Septum midline. Mucosa normal. No drainage or sinus tenderness. Throat: lips, mucosa, and tongue normal; teeth and gums normal 2+ tonsillar swelling bilaterally. No trismus, stridor, uvular deviation or drooling. Laryngitis with no exudate Neck: no adenopathy Lungs: clear to auscultation bilaterally Heart: regular rate and rhythm, S1, S2 normal, no murmur, click, rub or gallop Skin: Skin color, texture, turgor normal. No rashes or lesions    Recent Results (from the past 14 hour(s))  POCT Strep Nucleic Acid   Collection Time: 01/09/23 11:40 AM  Result Value Ref Range   Strep Negative Negative  POCT CoVid-19 nucleic acid   Collection Time: 01/09/23 11:51 AM  Result Value Ref Range   SARS-COV-2, NAA Negative Negative    No results found.

## 2023-04-24 ENCOUNTER — Other Ambulatory Visit (HOSPITAL_COMMUNITY)
Admission: RE | Admit: 2023-04-24 | Discharge: 2023-04-24 | Disposition: A | Discharge status: Home / Self Care | Payer: Self-pay | Source: Ambulatory Visit | Attending: Nurse Practitioner | Admitting: Nurse Practitioner

## 2023-04-24 DIAGNOSIS — Z124 Encounter for screening for malignant neoplasm of cervix: Secondary | ICD-10-CM | POA: Insufficient documentation

## 2023-04-27 LAB — CYTOLOGY - PAP: Diagnosis: NEGATIVE

## 2023-07-31 ENCOUNTER — Emergency Department: Payer: Self-pay

## 2023-07-31 ENCOUNTER — Emergency Department
Admission: EM | Admit: 2023-07-31 | Discharge: 2023-08-01 | Disposition: A | Discharge status: Home / Self Care | Payer: Self-pay | Attending: Emergency Medicine | Admitting: Emergency Medicine

## 2023-07-31 DIAGNOSIS — R109 Unspecified abdominal pain: Secondary | ICD-10-CM

## 2023-07-31 DIAGNOSIS — N12 Tubulo-interstitial nephritis, not specified as acute or chronic: Secondary | ICD-10-CM | POA: Insufficient documentation

## 2023-07-31 DIAGNOSIS — N83202 Unspecified ovarian cyst, left side: Secondary | ICD-10-CM | POA: Insufficient documentation

## 2023-07-31 DIAGNOSIS — N390 Urinary tract infection, site not specified: Secondary | ICD-10-CM | POA: Insufficient documentation

## 2023-07-31 LAB — BASIC METABOLIC PANEL WITH GFR
Anion gap: 10 (ref 5–15)
BUN: 13 mg/dL (ref 6–20)
CO2: 23 mmol/L (ref 22–32)
Calcium: 9 mg/dL (ref 8.9–10.3)
Chloride: 105 mmol/L (ref 98–111)
Creatinine, Ser: 0.78 mg/dL (ref 0.44–1.00)
GFR, Estimated: >60 mL/min (ref >60)
Glucose, Bld: 97 mg/dL (ref 70–99)
Potassium: 4.3 mmol/L (ref 3.5–5.1)
Sodium: 138 mmol/L (ref 135–145)

## 2023-07-31 LAB — URINALYSIS, ROUTINE W REFLEX MICROSCOPIC
Bilirubin Urine: NEGATIVE
Glucose, UA: NEGATIVE mg/dL
Ketones, ur: NEGATIVE mg/dL
Nitrite: NEGATIVE
Protein, ur: 30 mg/dL — AB
Specific Gravity, Urine: 1.016 (ref 1.005–1.030)
WBC, UA: >50 WBC/hpf (ref 0–5)
pH: 7 (ref 5.0–8.0)

## 2023-07-31 LAB — CBC
HCT: 40.3 % (ref 36.0–46.0)
Hemoglobin: 13 g/dL (ref 12.0–15.0)
MCH: 27.1 pg (ref 26.0–34.0)
MCHC: 32.3 g/dL (ref 30.0–36.0)
MCV: 84.1 fL (ref 80.0–100.0)
Platelets: 327 10*3/uL (ref 150–400)
RBC: 4.79 MIL/uL (ref 3.87–5.11)
RDW: 14 % (ref 11.5–15.5)
WBC: 7.8 10*3/uL (ref 4.0–10.5)
nRBC: 0 % (ref 0.0–0.2)

## 2023-07-31 LAB — POC URINE PREG, ED: Preg Test, Ur: NEGATIVE

## 2023-07-31 NOTE — ED Provider Triage Note (Signed)
 Emergency Medicine Provider Triage Evaluation Note  Brittney Boyle , a 28 y.o. female  was evaluated in triage.  Pt complains of flank pain with history of kidney stones that she has not been able to pass before.  Review of Systems  Positive: Flank pain Negative:   Physical Exam  BP (!) 128/91   Pulse 96   Temp 98.3 F (36.8 C) (Oral)   Resp 18   Ht 5\' 4"  (1.626 m)   Wt 113.4 kg   SpO2 100%   BMI 42.91 kg/m  Gen:   Awake, no distress   Resp:  Normal effort  MSK:   Moves extremities without difficulty  Other:    Medical Decision Making  Medically screening exam initiated at 5:09 PM.  Appropriate orders placed.  Brittney Boyle was informed that the remainder of the evaluation will be completed by another provider, this initial triage assessment does not replace that evaluation, and the importance of remaining in the ED until their evaluation is complete.     Brittney Breen, PA-C 07/31/23 1710

## 2023-07-31 NOTE — ED Triage Notes (Signed)
 Pt presents to the ED via POV from home for right sided flank pain that started last night. Hx of kidney stones and states that this feels the same. Pt reports minimal urination.

## 2023-07-31 NOTE — ED Provider Notes (Signed)
 [facility] Provider Note    Event Date/Time   First MD Initiated Contact with Patient 07/31/23 2306     (approximate)   History   Flank Pain   HPI {Remember to add pertinent medical, surgical, social, and/or OB history to HPI:1} Brittney Boyle is a 28 y.o. female  ***       Past Medical History   Past Medical History:  Diagnosis Date  . Anemia   . Depression   . GERD (gastroesophageal reflux disease)   . History of kidney stones    H/O  . IBS (irritable bowel syndrome)   . Kidney stone   . Migraines      Active Problem List   Patient Active Problem List   Diagnosis Date Noted  . Anxiety and depression 03/13/2017  . Factor V Leiden (HCC) 03/13/2017  . Migraine without aura and without status migrainosus, not intractable 03/13/2017  . Sleep disturbance 03/13/2017     Past Surgical History   Past Surgical History:  Procedure Laterality Date  . CHOLECYSTECTOMY N/A 08/15/2017   Procedure: LAPAROSCOPIC CHOLECYSTECTOMY;  Surgeon: Franki Isles, MD;  Location: [facility] ORS;  Service: General;  Laterality: N/A;  . TONSILLECTOMY    . WISDOM TOOTH EXTRACTION       Home Medications   Prior to Admission medications   Medication Sig Start Date End Date Taking? Authorizing Provider  ASHWAGANDHA PO Take 2 tablets by mouth daily.    [provider]  aspirin-acetaminophen -caffeine (EXCEDRIN MIGRAINE) 250-250-65 MG tablet Take 1 tablet by mouth daily as needed for migraine.    [provider]  azithromycin  (ZITHROMAX ) 250 MG tablet Take 1 tablet (250 mg total) by mouth daily. Take first 2 tablets together, then 1 every day until finished. 03/06/19   Stephany Ehrich, MD  benzonatate  (TESSALON ) 200 MG capsule Take 1 capsule (200 mg total) by mouth 2 (two) times daily as needed for cough. 03/06/19   Stephany Ehrich, MD  dextromethorphan-guaiFENesin  (MUCINEX  DM) 30-600 MG 12hr tablet Take 1 tablet by mouth 2 (two) times  daily. 03/06/19   Stephany Ehrich, MD  ibuprofen  (ADVIL ) 600 MG tablet Take 1 tablet (600 mg total) by mouth every 6 (six) hours as needed. 10/18/18   Mesner, Reymundo Caulk, MD  ketorolac  (TORADOL ) 10 MG tablet Take 1 tablet (10 mg total) by mouth every 6 (six) hours as needed. 06/18/21   Cuthriell, Ardath Bears, PA-C  Levonorgestrel-Ethinyl Estradiol (AMETHIA,CAMRESE) 0.1-0.02 & 0.01 MG tablet Take 1 tablet by mouth daily. 08/15/17   [provider]  meloxicam  (MOBIC ) 15 MG tablet Take 1 tablet (15 mg total) by mouth daily. 08/20/20   Geiple, Joshua, PA-C  Multiple Vitamin (MULTIVITAMIN WITH MINERALS) TABS tablet Take 1 tablet by mouth daily.    [provider]  ondansetron  (ZOFRAN -ODT) 4 MG disintegrating tablet Take 1 tablet (4 mg total) by mouth every 8 (eight) hours as needed for nausea or vomiting. 06/18/21   Cuthriell, Jonathan D, PA-C  oxyCODONE -acetaminophen  (PERCOCET/ROXICET) 5-325 MG tablet Take 1 tablet by mouth every 6 (six) hours as needed for severe pain. 06/18/21   Cuthriell, Ardath Bears, PA-C  predniSONE  (DELTASONE ) 20 MG tablet Take 1 tablet (20 mg total) by mouth 2 (two) times daily with a meal. 03/06/19   Stephany Ehrich, MD  Probiotic Product ([employer] ADV PROBIOTIC GUMMIES) CHEW Chew 2 each by mouth daily.    [provider]  tamsulosin  (FLOMAX ) 0.4 MG CAPS capsule Take 1 capsule (0.4 mg  total) by mouth daily. 06/18/21   Cuthriell, Jonathan D, PA-C  venlafaxine XR (EFFEXOR-XR) 150 MG 24 hr capsule Take 300 mg by mouth daily with breakfast.  03/14/17 10/18/18  [provider]     Allergies  Sulfa antibiotics and Shellfish allergy   Family History   Family History  Problem Relation Age of Onset  . Endometrial cancer Maternal Grandmother   . Thyroid cancer Paternal Grandmother   . Breast cancer Other      Physical Exam  Triage Vital Signs: ED Triage Vitals  Encounter Vitals Group     BP 07/31/23 1705 (!) 128/91     Systolic BP Percentile --       Diastolic BP Percentile --      Pulse Rate 07/31/23 1705 96     Resp 07/31/23 1705 18     Temp 07/31/23 1705 98.3 F (36.8 C)     Temp Source 07/31/23 1705 Oral     SpO2 07/31/23 1705 100 %     Weight 07/31/23 1706 250 lb (113.4 kg)     Height 07/31/23 1706 5\' 4"  (1.626 m)     Head Circumference --      Peak Flow --      Pain Score 07/31/23 1706 8     Pain Loc --      Pain Education --      Exclude from Growth Chart --     Updated Vital Signs: BP (!) 105/57   Pulse 70   Temp 98.3 F (36.8 C) (Oral)   Resp 18   Ht 5\' 4"  (1.626 m)   Wt 113.4 kg   SpO2 100%   BMI 42.91 kg/m   {Only need to document appropriate and relevant physical exam:1} General: Awake, no distress. *** CV:  Good peripheral perfusion. *** Resp:  Normal effort. *** Abd:  No distention. *** Other:  ***   ED Results / Procedures / Treatments  Labs (all labs ordered are listed, but only abnormal results are displayed) Labs Reviewed  URINALYSIS, ROUTINE W REFLEX MICROSCOPIC - Abnormal; Notable for the following components:      Result Value   Color, Urine YELLOW (*)    APPearance CLOUDY (*)    Hgb urine dipstick LARGE (*)    Protein, ur 30 (*)    Leukocytes,Ua LARGE (*)    Bacteria, UA FEW (*)    All other components within normal limits  BASIC METABOLIC PANEL WITH GFR  CBC  POC URINE PREG, ED     EKG  ***   RADIOLOGY *** {You MUST document your own interpretation of imaging, as well as the fact that you reviewed the radiologist's report!:1}  Official radiology report(s): CT Renal Stone Study Result Date: 07/31/2023 CLINICAL DATA:  Flank pain on the right, initial encounter EXAM: CT ABDOMEN AND PELVIS WITHOUT CONTRAST TECHNIQUE: Multidetector CT imaging of the abdomen and pelvis was performed following the standard protocol without IV contrast. RADIATION DOSE REDUCTION: This exam was performed according to the departmental dose-optimization program which includes automated exposure  control, adjustment of the mA and/or kV according to patient size and/or use of iterative reconstruction technique. COMPARISON:  06/18/2021 FINDINGS: Lower chest: No acute abnormality. Hepatobiliary: No focal liver abnormality is seen. Status post cholecystectomy. No biliary dilatation. Pancreas: Unremarkable. No pancreatic ductal dilatation or surrounding inflammatory changes. Spleen: Normal in size without focal abnormality. Adrenals/Urinary Tract: Adrenal glands are within normal limits. Kidneys demonstrate multiple nonobstructing renal calculi bilaterally. The largest of these lies  in the lower pole of the right kidney measuring up to 5 mm. No obstructive changes are seen. The bladder is partially distended. Stomach/Bowel: No obstructive or inflammatory changes of the colon are seen. The appendix is within normal limits. No obstructive or inflammatory changes in the small bowel. Stomach is unremarkable. Vascular/Lymphatic: No significant vascular findings are present. No enlarged abdominal or pelvic lymph nodes. Reproductive: Uterus is within normal limits. Simple appearing left ovarian cyst is noted measuring 3.5 cm. Other: No abdominal wall hernia or abnormality. No abdominopelvic ascites. Musculoskeletal: No acute or significant osseous findings. IMPRESSION: Bilateral nonobstructing renal calculi right greater than left. Left ovarian simple-appearing cyst measuring 3.5 cm. No follow-up imaging is recommended. Reference: JACR 2020 Feb;17(2):248-254 No other focal abnormality is noted. Electronically Signed   By: Violeta Grey M.D.   On: 07/31/2023 19:07     PROCEDURES:  Critical Care performed: {CriticalCareYesNo:19197::"Yes, see critical care procedure note(s)","No"}  Procedures   MEDICATIONS ORDERED IN ED: Medications - No data to display   IMPRESSION / MDM / ASSESSMENT AND PLAN / ED COURSE  I reviewed the triage vital signs and the nursing notes.                              Differential  diagnosis includes, but is not limited to, ***  Patient's presentation is most consistent with {EM COPA:27473}  {If the patient is on the monitor, remove the brackets and asterisks on the sentence below and remember to document it as a Procedure as well. Otherwise delete the sentence below:1} {**The patient is on the cardiac monitor to evaluate for evidence of arrhythmia and/or significant heart rate changes.**}  {Remember to include, when applicable, any/all of the following data: independent review of imaging independent review of labs (comment specifically on pertinent positives and negatives) review of specific prior hospitalizations, PCP/specialist notes, etc. discuss meds given and prescribed document any discussion with consultants (including hospitalists) any clinical decision tools you used and why (PECARN, NEXUS, etc.) did you consider admitting the patient? document social determinants of health affecting patient's care (homelessness, inability to follow up in a timely fashion, etc) document any pre-existing conditions increasing risk on current visit (e.g. diabetes and HTN increasing danger of high-risk chest pain/ACS) describes what meds you gave (especially parenteral) and why any other interventions?:1}      FINAL CLINICAL IMPRESSION(S) / ED DIAGNOSES   Final diagnoses:  Right flank pain  Lower urinary tract infectious disease     Rx / DC Orders   ED Discharge Orders     None        Note:  This document was prepared using Dragon voice recognition software and may include unintentional dictation errors.

## 2023-08-01 MED ORDER — OXYCODONE-ACETAMINOPHEN 5-325 MG PO TABS
1.0000 | ORAL_TABLET | Freq: Once | ORAL | Status: AC
  Administered 2023-08-01: 1 via ORAL
  Filled 2023-08-01: qty 1

## 2023-08-01 MED ORDER — HYDROMORPHONE HCL 1 MG/ML IJ SOLN
0.5000 mg | Freq: Once | INTRAMUSCULAR | Status: AC
  Administered 2023-08-01: 0.5 mg via INTRAVENOUS
  Filled 2023-08-01: qty 0.5

## 2023-08-01 MED ORDER — ONDANSETRON 4 MG PO TBDP: 4.0000 mg | ORAL_TABLET | Freq: Three times a day (TID) | ORAL | 0 refills | Status: DC | PRN

## 2023-08-01 MED ORDER — CEPHALEXIN 500 MG PO CAPS
500.0000 mg | ORAL_CAPSULE | Freq: Three times a day (TID) | ORAL | 0 refills | Status: AC
Start: 2023-08-01 — End: ?

## 2023-08-01 MED ORDER — SODIUM CHLORIDE 0.9 % IV BOLUS
1000.0000 mL | Freq: Once | INTRAVENOUS | Status: AC
  Administered 2023-08-01: 1000 mL via INTRAVENOUS

## 2023-08-01 MED ORDER — KETOROLAC TROMETHAMINE 30 MG/ML IJ SOLN
15.0000 mg | Freq: Once | INTRAMUSCULAR | Status: AC
  Administered 2023-08-01: 15 mg via INTRAVENOUS
  Filled 2023-08-01: qty 1

## 2023-08-01 MED ORDER — SODIUM CHLORIDE 0.9 % IV SOLN
1.0000 g | Freq: Once | INTRAVENOUS | Status: AC
  Administered 2023-08-01: 1 g via INTRAVENOUS
  Filled 2023-08-01: qty 10

## 2023-08-01 MED ORDER — OXYCODONE-ACETAMINOPHEN 5-325 MG PO TABS: 1.0000 | ORAL_TABLET | ORAL | 0 refills | Status: DC | PRN

## 2023-08-01 MED ORDER — ONDANSETRON HCL 4 MG/2ML IJ SOLN
4.0000 mg | Freq: Once | INTRAMUSCULAR | Status: DC
  Filled 2023-08-01: qty 2

## 2023-08-01 MED ORDER — ONDANSETRON HCL 4 MG/2ML IJ SOLN
4.0000 mg | Freq: Once | INTRAMUSCULAR | Status: AC
  Administered 2023-08-01: 4 mg via INTRAVENOUS
  Filled 2023-08-01: qty 2

## 2023-08-01 NOTE — Discharge Instructions (Signed)
 Take and finish antibiotic as prescribed.  You may take ibuprofen  as needed for pain, Percocet as needed for more severe pain.  You may take Zofran  as needed for nausea.  Drink plenty of fluids daily.  Return to the ER for worsening symptoms, persistent vomiting, fever or other concerns.[name]

## 2023-08-01 NOTE — ED Notes (Signed)
 Pt presented to ED with c/o left sided flank pain. Hx of kidney stones but pt states she normally is able to pass them. States this pain is 9/10. Pt states used home pain medication without relief. Endorses nausea.

## 2023-08-02 LAB — URINE CULTURE

## 2023-09-28 IMAGING — CT CT ABD-PELV W/ CM
2 of 4 series · 16 of 46 positions shown, 18 images · IV contrast (agent unspecified)
Comparison: 10/24/2018

CLINICAL DATA: Right lower quadrant pain with nausea and vomiting
for 2 days, initial encounter

EXAM:
CT ABDOMEN AND PELVIS WITH CONTRAST
TECHNIQUE: Multidetector CT imaging of the abdomen and pelvis was performed
using the standard protocol following bolus administration of
intravenous contrast.

[Series 2: abdomen 5.0 · axial · 0.76mm/px · z∈[-935,-530]mm · 13 of 91 slices shown, 15 images]
[im 5/91  soft-tissue]
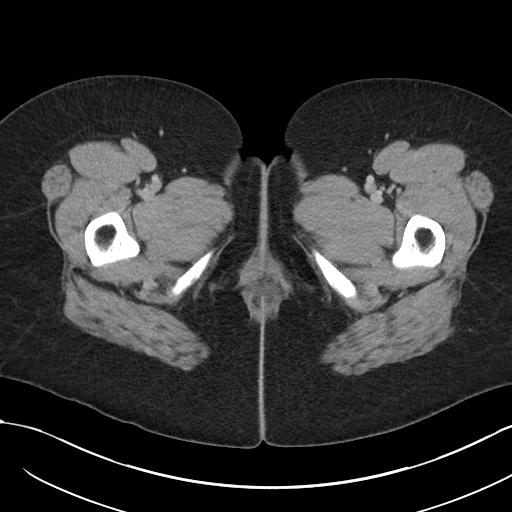
[im 5/91  bone]
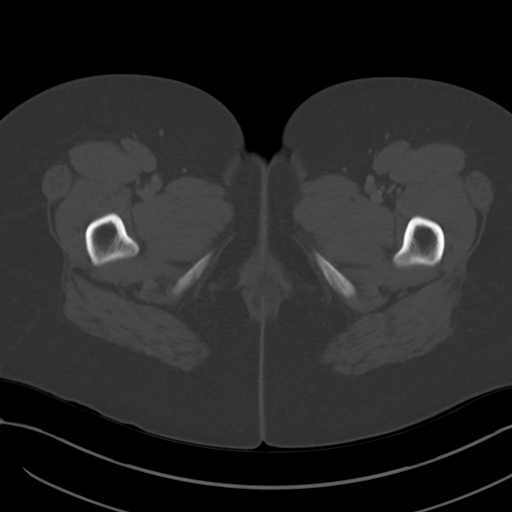
[im 14/91  soft-tissue]
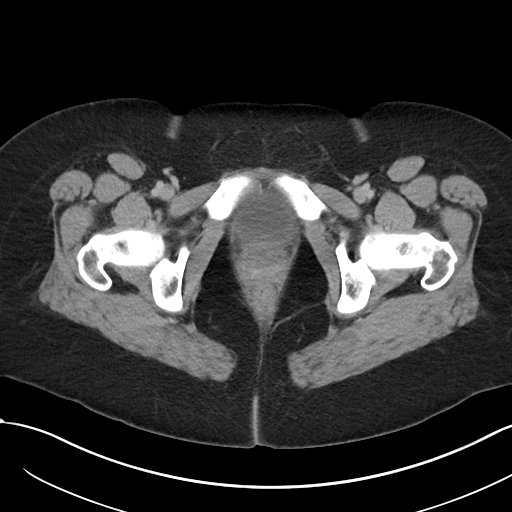
[im 19/91  soft-tissue]
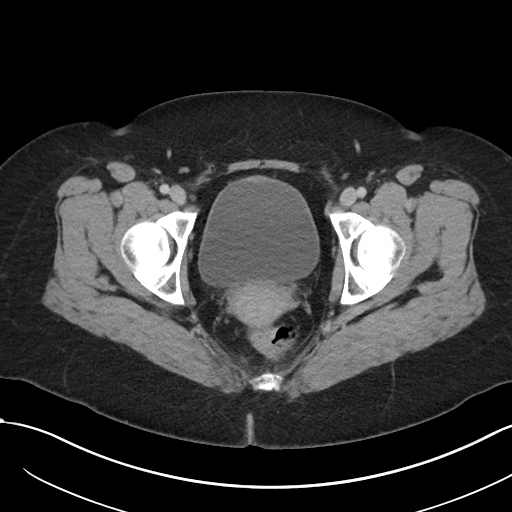
[im 28/91  soft-tissue]
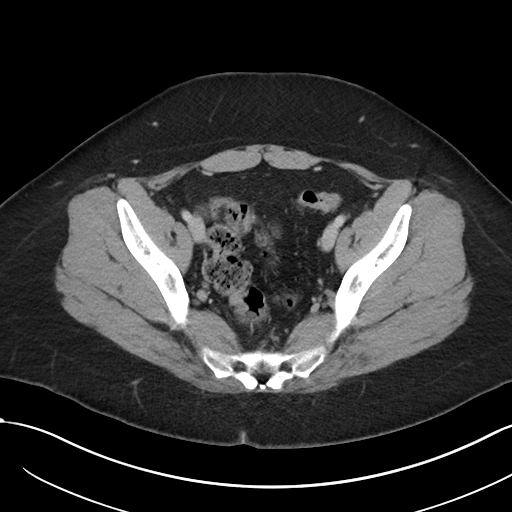
[im 32/91  soft-tissue]
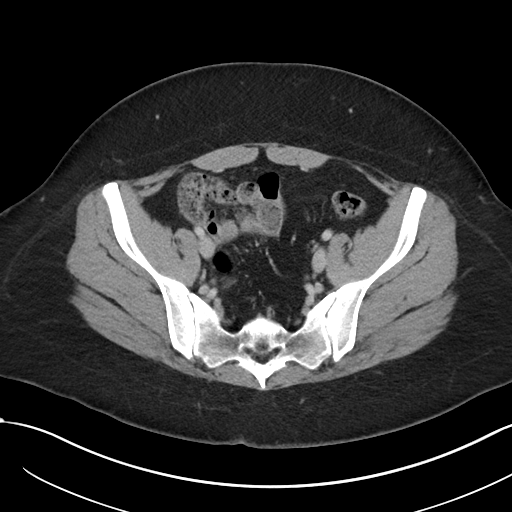
[im 41/91  soft-tissue]
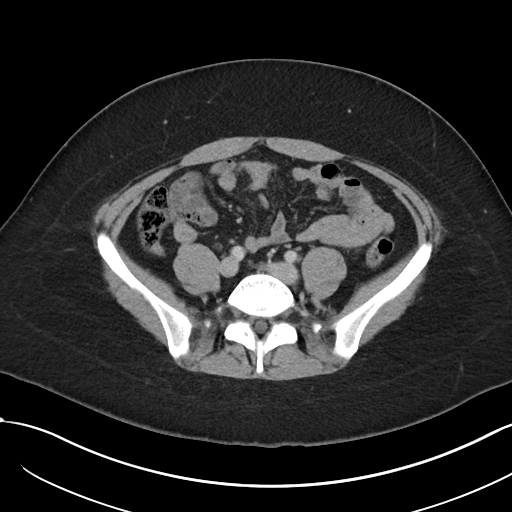
[im 46/91  soft-tissue]
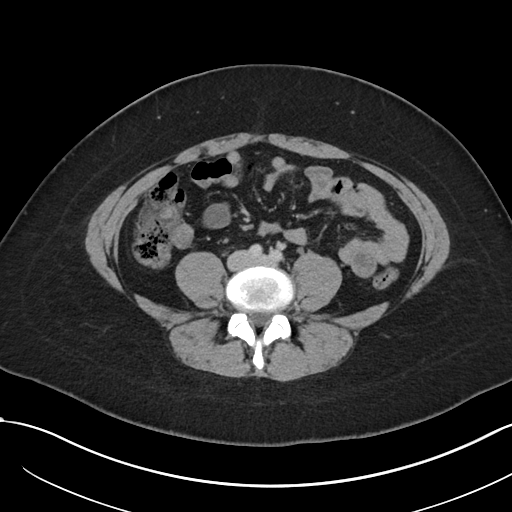
[im 50/91  soft-tissue]
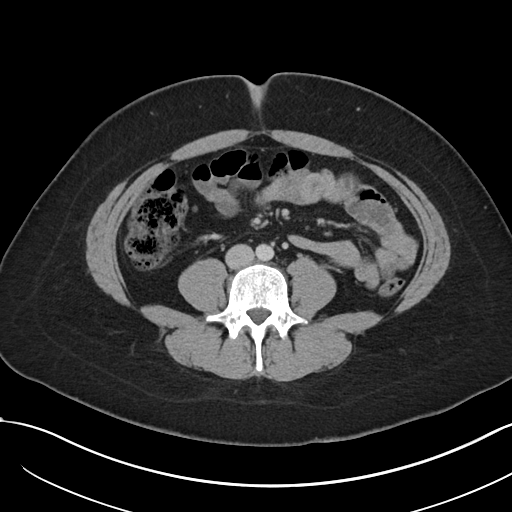
[im 59/91  soft-tissue]
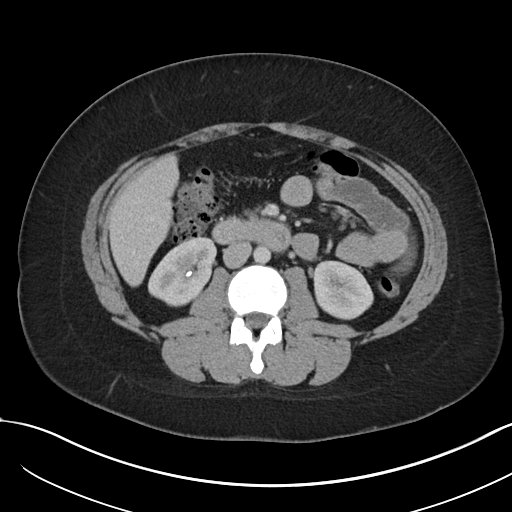
[im 59/91  bone]
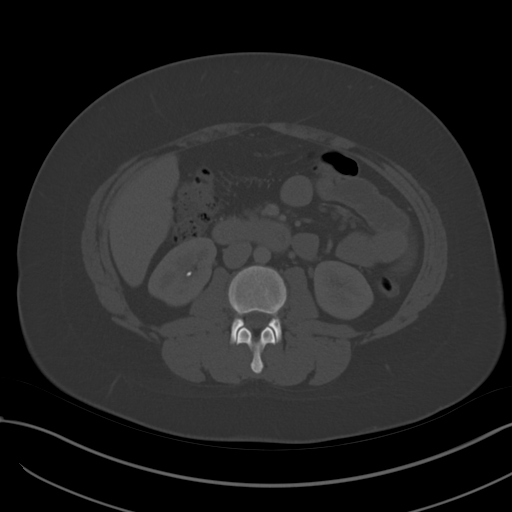
[im 64/91  soft-tissue]
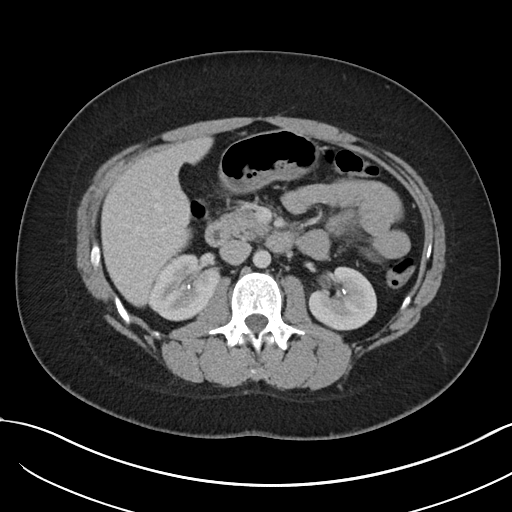
[im 73/91  soft-tissue]
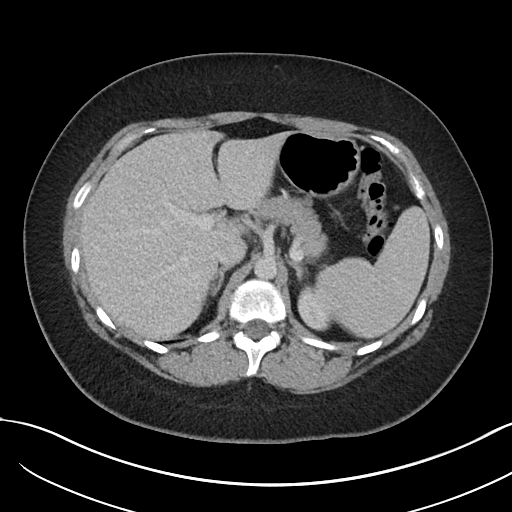
[im 77/91  soft-tissue]
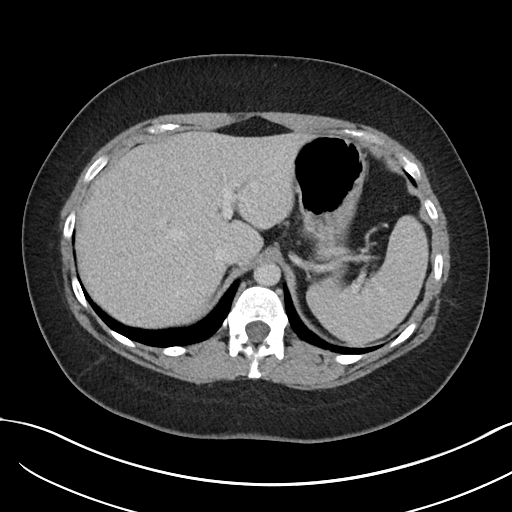
[im 86/91  soft-tissue]
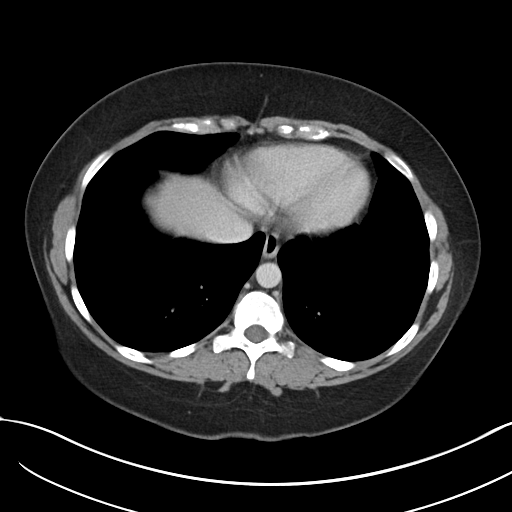

[Series 5: abdomen 3.0 mpr cor · coronal · 0.81mm/px · 3 of 102 slices shown]
[im 34/102  soft-tissue]
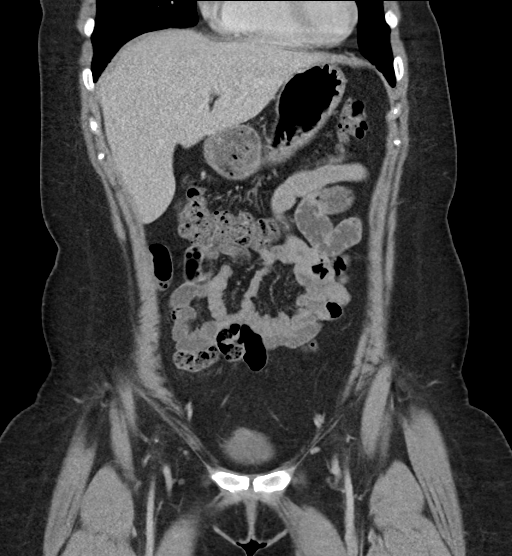
[im 45/102  soft-tissue]
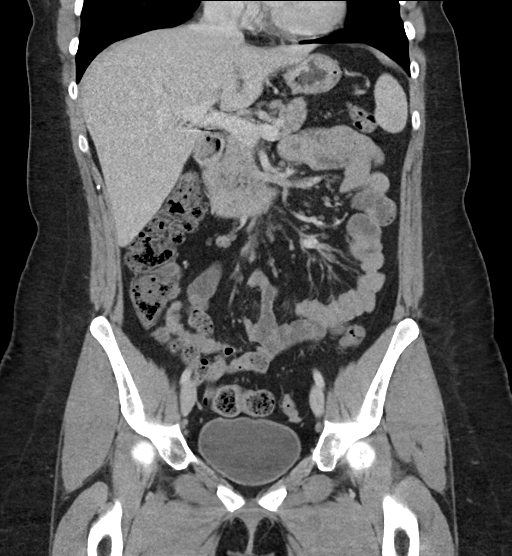
[im 57/102  soft-tissue]
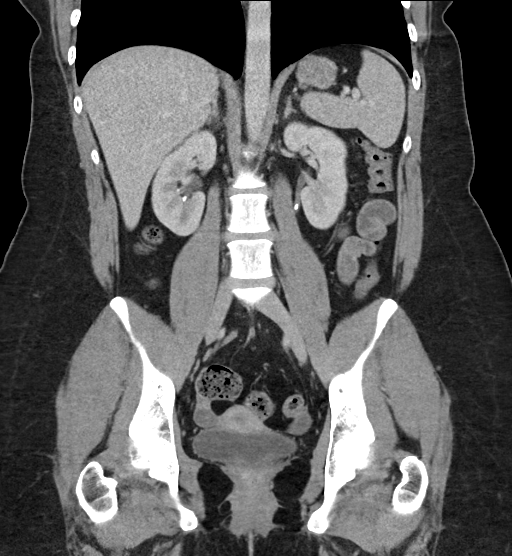

[16 of 46 positions shown; findings below may reference images not displayed]

RADIATION DOSE REDUCTION: This exam was performed according to the
departmental dose-optimization program which includes automated
exposure control, adjustment of the mA and/or kV according to
patient size and/or use of iterative reconstruction technique.

CONTRAST:  80mL OMNIPAQUE IOHEXOL 300 MG/ML  SOLN
FINDINGS: Lower chest: No acute abnormality.

Hepatobiliary: No focal liver abnormality is seen. Status post
cholecystectomy. No biliary dilatation.

Pancreas: Unremarkable. No pancreatic ductal dilatation or
surrounding inflammatory changes.

Spleen: Normal in size without focal abnormality.

Adrenals/Urinary Tract: Adrenal glands are within normal limits.
Kidneys are well visualized bilaterally. Renal calculi are noted
bilaterally. The bladder is well distended. The ureters show
evidence of a 6 mm proximal left ureteral stone with very minimal
fullness of the renal collecting system. No obstructive changes are
noted on the right.

Stomach/Bowel: No obstructive or inflammatory changes of the colon
are seen. The appendix is within normal limits. Small bowel and
stomach are unremarkable.

Vascular/Lymphatic: No significant vascular findings are present. No
enlarged abdominal or pelvic lymph nodes.

Reproductive: Uterus and bilateral adnexa are unremarkable.

Other: No abdominal wall hernia or abnormality. No abdominopelvic
ascites.

Musculoskeletal: No acute or significant osseous findings.
IMPRESSION: 6 mm proximal left ureteral stone with very minimal fullness of the
left renal collecting system.

Nonobstructing renal calculi bilaterally measuring up to 4 mm on the
right.

Normal-appearing appendix.

## 2023-12-03 NOTE — Progress Notes (Signed)
 [address] - AMBULATORY [facility] [address] [place] [address]   Date of Service: 12/03/2023 Patient DOB: 1996-01-08    History of Present Illness   Patient ID: Brittney Boyle is a 28 y.o. female. Patient with PMH of migraine, Factor V Leiden presents to urgent care due to left sided blurry vision.  Patient states that approximately 3 days ago she started to experience left eye pressure, pain, headache, blurry vision.  Reports pain with EOM and pressure behind her eye in which she rates pain 7/10 despite using Tylenol . Associated photophobia at times. Blurry vision worse if she does not wear her glasses or with bending over, coughing, laughing, sneezing.  Does report history of migraines but has not experienced current symptoms in the past.  Reports progressive worsening of symptoms.  Denies dizziness, confusion, loss of vision, neck pain, eye redness, discharge, sensation of foreign body, nausea, vomiting, weakness, numbness, tingling.   Active Ambulatory Problems    Diagnosis Date Noted  . Anxiety and depression 03/13/2017  . Factor V Leiden (HCC) 03/13/2017  . Migraine without aura and without status migrainosus, not intractable 03/13/2017  . Sleep disturbance 03/13/2017   Resolved Ambulatory Problems    Diagnosis Date Noted  . No Resolved Ambulatory Problems   Past Medical History:  Diagnosis Date  . Kidney stones   . Migraine     BP 131/87 (BP Location: Right arm, Patient Position: Sitting)   Pulse 81   Ht 1.626 m (5' 4)   Wt 113 kg (250 lb)   BMI 42.91 kg/m    Review of Systems   Review of Systems  Eyes:  Positive for pain and visual disturbance.     Physicial Exam   Physical Exam Vitals and nursing note reviewed.  Constitutional:      Appearance: Normal appearance. She is normal weight.  HENT:     Head: Normocephalic and atraumatic.     Right Ear: External ear  normal.     Left Ear: External ear normal.     Nose: Nose normal.     Mouth/Throat:     Mouth: Mucous membranes are moist.     Pharynx: Oropharynx is clear.  Eyes:     General: Lids are normal. Lids are everted, no foreign bodies appreciated. Vision grossly intact.        Right eye: No foreign body, discharge or hordeolum.        Left eye: No foreign body, discharge or hordeolum.     Extraocular Movements: Extraocular movements intact.     Conjunctiva/sclera: Conjunctivae normal.     Pupils: Pupils are equal, round, and reactive to light.     Funduscopic exam:       Left eye: Red reflex present.    Visual Fields: Right eye visual fields normal and left eye visual fields normal.  Cardiovascular:     Rate and Rhythm: Normal rate and regular rhythm.     Pulses: Normal pulses.     Heart sounds: Normal heart sounds.  Pulmonary:     Effort: Pulmonary effort is normal.     Breath sounds: Normal breath sounds.  Abdominal:     General: Abdomen is flat. Bowel sounds are normal.     Palpations: Abdomen is soft.  Musculoskeletal:        General: Normal range of motion.     Cervical back: Normal range of motion and neck supple.  Skin:  General: Skin is warm and dry.  Neurological:     General: No focal deficit present.     Mental Status: She is alert and oriented to person, place, and time.  Psychiatric:        Mood and Affect: Mood normal.      Diagnosis   Brittney Boyle was seen today for eye problem.  Diagnoses and all orders for this visit:  Blurry vision, left eye -     Ambulatory referral to Ophthalmology; Future  Eye pressure -     Ambulatory referral to Ophthalmology; Future     Medical Decision Making [name] is a 28 y.o. female who presents to urgent care today with complaints of  Chief Complaint  Patient presents with  . Eye Problem    Patient states she started to experiencing left eye pressure on Friday. Patient states her vision started to become  blurry.     On exam, VS stable and patient is afebrile. Appears well-hydrated and capillary refill <2 seconds.  DDX: Considered emergent conditions (gonococcal conjunctivitis, corneal foreign body, herpes keratitis, corneal ulcer/laceration, acute angle closure glaucoma, MIS-C) as well as more common conditions (bacterial/viral/allergic conjunctivitis, corneal abrasion, acute hemorrhagic conjunctivitis, blepharitis, hordeolum/chalazion, orbital/periorbital cellulitis, episcleritis), migraine, CVA, MS, GCA  Patient presents to urgent care due to left eye pressure and blurry vision. Visual acuity 20/25 bilaterally.  On exam, patient is not appear to exhibit any neurodeficits or abnormal findings at this time.  Given new onset of symptoms and progressive worsening, urgent referral to ophthalmology provided.  Extensive discussion with patient regarding symptoms that would warrant ED evaluation provided.   Discharge Information  Symptomatic management discussed.  Patient was given verbal and written instructions on symptoms that necessitate return to the UC/ED, and instructed to f/u w/ UC or PCP if not improving in expected timeframe.   Patient/parent has been instructed on RX/OTC medications, dosages, side effects, and possible interactions as associated with each diagnosis in my impression and plan above.   Patient education (verbal/handout) given on diagnosis, pathophysiology, treatment of diagnosis, side effects of medication use for treatment, restrictions while taking medication, supportives measures such as staying hydrated.   Red Flags associated with diagnosis/es were reviewed and patient instructed on action plan if red flags develop.   They have been instructed that if symptoms worsen or red flags develop they should return to Urgent Care, go to the nearest ED, or activate EMS/911.     Patient and/or parent/guardian (if applicable) agreed with plan and voiced understanding.  No barriers to  adherence perceived by myself.   Portions of this note may have been dictated using Dragon dictation software/hardware and may contain grammatical or spelling errors.    If a new prescription was given today, then I discussed potential side effects, drug interactions, instructions for taking the medication, and the consequences of not taking it.    F/u: Follow up closely with primary care provider (PCP) and other specialists for further care and routine care, but seek medical attention sooner if worsening/concerning signs or symptoms.  Urgent Follow Up with Specialist   Electronically signed by: Darryle Slater Fish, PA-C 12/03/2023 8:39 PM

## 2023-12-12 NOTE — Progress Notes (Signed)
 Ophthalmology Department Clinical Visit Note  12/12/23      CHIEF COMPLAINT Patient presents for Consult (Pt presents to clinic with new blurred vision and pain. She states that her OS was swollen and photophobic. Condition started to improve last Saturday. Pt reports VA is improved as well but would like to be looked at because she has no previous hx of past styes. )   HISTORY OF PRESENT ILLNESS: Brittney Boyle is a 28 y.o. female who  has a past medical history of Factor V Leiden (CMD), Kidney stones, and Migraine. presents to clinic for: c/f stye   HPI     Consult    Additional comments: Pt presents to clinic with new blurred vision and pain. She states that her OS was swollen and photophobic. Condition started to improve last Saturday. Pt reports VA is improved as well but would like to be looked at because she has no previous hx of past styes.       Last edited by Brittney Boyle, COA on 12/12/2023  4:22 PM.        HISTORICAL INFORMATION:   Selected notes from the MEDICAL RECORD NUMBER  Patient requesting to schedule her URGENT referral in system from PA Brittney Boyle to be seen for Blurry vision, left eye-eye pressure. Patient was seen at [facility] on 10.13.2025.     CURRENT MEDICATIONS: Encounter Medications[1]  Referring physician: Darryle Slater Fish, PA-C [address] [place],  [place] [address]  REVIEW OF SYSTEMS: ROS   Positive for: Eyes Last edited by Brittney Boyle, COA on 12/12/2023  4:23 PM.      ALLERGIES Allergies[2]  PAST MEDICAL HISTORY Medical History[3] Surgical History[4]  FAMILY HISTORY Family History[5]  SOCIAL HISTORY Social History[6]      OPHTHALMIC EXAM:  Base Eye Exam     Visual Acuity (Snellen - Linear)       Right Left   Dist cc 20/20 -1 20/20 -1    Correction: Glasses         Tonometry (Tonopen: Tetracaine OU, 4:28 PM)        Right Left   Pressure 17 16         Dilation     Both eyes: 2.5% Phenylephrine, 1.0% Tropicamide @ 4:57 PM           Slit Lamp and Fundus Exam     External Exam       Right Left   External Normal Normal         Slit Lamp Exam       Right Left   Lids/Lashes tr+ MGD tr+ MGD   Conjunctiva/Sclera Small follicles on palpebral conj, no injection of bulbar conj Small follicles on palpebral conj, no injection of bulbar conj   Cornea Clear Clear   Anterior Chamber Deep and quiet Deep and quiet   Iris Round and reactive Round and reactive   Lens Clear Clear   Anterior Vitreous Normal Normal         Fundus Exam       Right Left   Disc Sharp, normal Sharp, normal   C/D Ratio 0.3 0.3   Macula Normal foveal light reflex Normal foveal light reflex   Vessels Normal course and caliber Normal course and caliber   Periphery Flat and intact Flat and intact              IMAGING AND PROCEDURES:  ASSESSMENT/PLAN:    ICD-10-CM   1. Allergic conjunctivitis of both eyes  H10.13     2. Hordeolum externum of left upper eyelid  H00.014       Brittney Boyle is a 28 y.o. female who presents with:    #Seasonal Allergic Conjunctivitis, OU #Chalazion, OS, resolved ------------------------------------------------------------- Initial visit 12/12/23: Few days of eyelid pain, intermittent blurry vision. Found a lump on her eyelid and took a picture. Looks like a stye to me.  - Today, stye resolved on its own but has itchy eyes and history of seasonal allergies. Exam w/ follicles - No palpable/tender preauricular lymph node PLAN - Olopatadine 1x/OU - On Allegra qAM - AT's PRN, up to 4x - Measures to prevent styes:  - Satin pillowcase  - Daily warm compress  - Reduced makeup  - Omega-3 Fatty Acid Supplementation  Return in about 4 months (around 04/13/2024) for With Dr. Erle or Next Available Resident.  Explained the diagnoses, plan, and  follow up with the patient and they expressed understanding.  Patient expressed understanding of the importance of proper follow up care.   I have reviewed the portions of the history and exam completed by the technician, including review of systems, and agree.   Brittney Kai Pruett, MD PGY-2 Ophthalmology    Ophthalmic Meds/Labs Ordered this visit:  No orders of the defined types were placed in this encounter.   New Medications Ordered This Visit  Medications  . olopatadine (PATADAY) 0.2 % ophthalmic solution    Sig: Administer 1 drop into both eyes daily.    Dispense:  5 mL    Refill:  3         Patient Instructions  START olapatadine daily in both eyes Use artificial tears as needed, up to 4x/day  Measures to avoid styes: Omega-3 fatty acid supplement Satin pillowcase Daily warm compress  [phone]    Abbreviations: M myopia (nearsighted); A astigmatism; H hyperopia (farsighted); P presbyopia; Mrx spectacle prescription;  CTL contact lenses; OD right eye; OS left eye; OU both eyes  XT exotropia; ET esotropia; PEK punctate epithelial keratitis; PEE punctate epithelial erosions; DES dry eye syndrome; MGD meibomian gland dysfunction; ATs artificial tears; PFAT's preservative free artificial tears; NSC nuclear sclerotic cataract; PSC posterior subcapsular cataract; ERM epi-retinal membrane; PVD posterior vitreous detachment; RD retinal detachment; DM diabetes mellitus; DR diabetic retinopathy; NPDR non-proliferative diabetic retinopathy; PDR proliferative diabetic retinopathy; CSME clinically significant macular edema; DME diabetic macular edema; dbh dot blot hemorrhages; CWS cotton wool spot; POAG primary open angle glaucoma; C/D cup-to-disc ratio; HVF humphrey visual field; GVF goldmann visual field; OCT optical coherence tomography; IOP intraocular pressure; BRVO Branch retinal vein occlusion; CRVO central retinal vein occlusion; CRAO central retinal artery occlusion; BRAO  branch retinal artery occlusion; RT retinal tear; SB scleral buckle; PPV pars plana vitrectomy; VH Vitreous hemorrhage; PRP panretinal laser photocoagulation; IVK intravitreal kenalog; VMT vitreomacular traction; MH Macular hole;  NVD neovascularization of the disc; NVE neovascularization elsewhere; AREDS age related eye disease study; ARMD age related macular degeneration; POAG primary open angle glaucoma; EBMD epithelial/anterior basement membrane dystrophy; ACIOL anterior chamber intraocular lens; IOL intraocular lens; PCIOL posterior chamber intraocular lens; Phaco/IOL phacoemulsification with intraocular lens placement; PRK photorefractive keratectomy; LASIK laser assisted in situ keratomileusis; HTN hypertension; DM diabetes mellitus; COPD chronic obstructive pulmonary disease         [1] Outpatient Encounter Medications as of 12/12/2023  Medication Sig Dispense Refill  . aspirin-acetaminophen -caffeine (Excedrin Extra Strength) 250-250-65 mg tab per tablet Take  by mouth. (Patient not taking: Reported on 12/03/2023)    . azelastine (ASTELIN) 137 mcg (0.1 %) nasal spray Administer 2 sprays into each nostril 2 (two) times a day as needed for rhinitis or allergies. Use in each nostril as directed (Patient not taking: Reported on 12/03/2023) 30 mL 0  . brompheniramine-pseudoePHEDrine-DM (BROMFED DM) 2-30-10 mg/5 mL syrp syrup Take 10 mL by mouth every 6 (six) hours as needed (cough or congestion). (Patient not taking: Reported on 12/03/2023) 180 mL 0  . norethindrone 0.35 mg tab take 1 tablet by mouth every day for 84 days    . olopatadine (PATADAY) 0.2 % ophthalmic solution Administer 1 drop into both eyes daily. 5 mL 3  . tamsulosin  (FLOMAX ) 0.4 mg cap TAKE 1 TABLET BY MOUTH DAILY WHEN KIDNEY STONE SYMPTOMS ARE PRESENT. (Patient not taking: Reported on 12/03/2023) 90 capsule 1   No facility-administered encounter medications on file as of 12/12/2023.  [2] Allergies Allergen Reactions  .  Sulfa (Sulfonamide Antibiotics) Swelling  . Shellfish Containing Products Rash  [3] Past Medical History: Diagnosis Date  . Factor V Leiden (CMD)   . Kidney stones   . Migraine   [4] Past Surgical History: Procedure Laterality Date  . CHOLECYSTECTOMY     Procedure: CHOLECYSTECTOMY  . TONSILLECTOMY     Procedure: TONSILLECTOMY  . WISDOM TOOTH EXTRACTION     Procedure: WISDOM TOOTH EXTRACTION  [5] Family History Problem Relation Name Age of Onset  . Kidney disease Mother    . Lung disease Father    . Lung disease Maternal Grandmother    . Diabetes Maternal Grandfather    . Thyroid disease Paternal Grandmother    [6] Social History Tobacco Use  . Smoking status: Never  . Smokeless tobacco: Never  Substance Use Topics  . Alcohol use: Yes  . Drug use: Not Currently    Frequency: 3.0 times per week    Types: Marijuana

## 2023-12-19 NOTE — Progress Notes (Signed)
 I reviewed the resident's note and agree with the plan as documented

## 2023-12-23 ENCOUNTER — Emergency Department (HOSPITAL_COMMUNITY)

## 2023-12-23 ENCOUNTER — Encounter (HOSPITAL_COMMUNITY): Payer: Self-pay

## 2023-12-23 ENCOUNTER — Emergency Department (HOSPITAL_COMMUNITY)
Admission: EM | Admit: 2023-12-23 | Discharge: 2023-12-23 | Disposition: A | Discharge status: Home / Self Care | Attending: Emergency Medicine | Admitting: Emergency Medicine

## 2023-12-23 DIAGNOSIS — R1033 Periumbilical pain: Secondary | ICD-10-CM | POA: Insufficient documentation

## 2023-12-23 DIAGNOSIS — R1013 Epigastric pain: Secondary | ICD-10-CM | POA: Insufficient documentation

## 2023-12-23 DIAGNOSIS — D72829 Elevated white blood cell count, unspecified: Secondary | ICD-10-CM | POA: Diagnosis not present

## 2023-12-23 DIAGNOSIS — Z7982 Long term (current) use of aspirin: Secondary | ICD-10-CM | POA: Diagnosis not present

## 2023-12-23 LAB — URINALYSIS, ROUTINE W REFLEX MICROSCOPIC
Bilirubin Urine: NEGATIVE
Glucose, UA: NEGATIVE mg/dL
Hgb urine dipstick: NEGATIVE
Ketones, ur: NEGATIVE mg/dL
Leukocytes,Ua: NEGATIVE
Nitrite: NEGATIVE
Protein, ur: NEGATIVE mg/dL
Specific Gravity, Urine: 1.039 — ABNORMAL HIGH (ref 1.005–1.030)
pH: 5 (ref 5.0–8.0)

## 2023-12-23 LAB — COMPREHENSIVE METABOLIC PANEL WITH GFR
ALT: 24 U/L (ref 0–44)
AST: 32 U/L (ref 15–41)
Albumin: 4.4 g/dL (ref 3.5–5.0)
Alkaline Phosphatase: 132 U/L — ABNORMAL HIGH (ref 38–126)
Anion gap: 13 (ref 5–15)
BUN: 12 mg/dL (ref 6–20)
CO2: 21 mmol/L — ABNORMAL LOW (ref 22–32)
Calcium: 9.5 mg/dL (ref 8.9–10.3)
Chloride: 105 mmol/L (ref 98–111)
Creatinine, Ser: 0.68 mg/dL (ref 0.44–1.00)
GFR, Estimated: >60 mL/min (ref >60)
Glucose, Bld: 91 mg/dL (ref 70–99)
Potassium: 4 mmol/L (ref 3.5–5.1)
Sodium: 139 mmol/L (ref 135–145)
Total Bilirubin: 0.5 mg/dL (ref 0.0–1.2)
Total Protein: 7.8 g/dL (ref 6.5–8.1)

## 2023-12-23 LAB — CBC
HCT: 42.4 % (ref 36.0–46.0)
Hemoglobin: 13 g/dL (ref 12.0–15.0)
MCH: 25.7 pg — ABNORMAL LOW (ref 26.0–34.0)
MCHC: 30.7 g/dL (ref 30.0–36.0)
MCV: 84 fL (ref 80.0–100.0)
Platelets: 314 K/uL (ref 150–400)
RBC: 5.05 MIL/uL (ref 3.87–5.11)
RDW: 14.2 % (ref 11.5–15.5)
WBC: 10.9 K/uL — ABNORMAL HIGH (ref 4.0–10.5)
nRBC: 0 % (ref 0.0–0.2)

## 2023-12-23 LAB — APTT: aPTT: 30 s (ref 24–36)

## 2023-12-23 LAB — HCG, SERUM, QUALITATIVE: Preg, Serum: NEGATIVE

## 2023-12-23 LAB — PROTIME-INR
INR: 1 (ref 0.8–1.2)
Prothrombin Time: 14 s (ref 11.4–15.2)

## 2023-12-23 LAB — D-DIMER, QUANTITATIVE: D-Dimer, Quant: 2.01 ug{FEU}/mL — ABNORMAL HIGH (ref 0.00–0.50)

## 2023-12-23 LAB — LIPASE, BLOOD: Lipase: 36 U/L (ref 11–51)

## 2023-12-23 MED ORDER — SODIUM CHLORIDE 0.9 % IV BOLUS
1000.0000 mL | Freq: Once | INTRAVENOUS | Status: AC
  Administered 2023-12-23: 1000 mL via INTRAVENOUS

## 2023-12-23 MED ORDER — ONDANSETRON 4 MG PO TBDP
4.0000 mg | ORAL_TABLET | Freq: Once | ORAL | Status: AC
  Administered 2023-12-23: 4 mg via ORAL
  Filled 2023-12-23: qty 1

## 2023-12-23 MED ORDER — IOHEXOL 350 MG/ML SOLN
75.0000 mL | Freq: Once | INTRAVENOUS | Status: AC | PRN
  Administered 2023-12-23: 75 mL via INTRAVENOUS

## 2023-12-23 MED ORDER — ONDANSETRON HCL 4 MG/2ML IJ SOLN
4.0000 mg | Freq: Once | INTRAMUSCULAR | Status: AC
  Administered 2023-12-23: 4 mg via INTRAVENOUS
  Filled 2023-12-23: qty 2

## 2023-12-23 MED ORDER — MORPHINE SULFATE (PF) 4 MG/ML IV SOLN
4.0000 mg | Freq: Once | INTRAVENOUS | Status: AC
Start: 2023-12-23 — End: 2023-12-23
  Administered 2023-12-23: 4 mg via INTRAVENOUS
  Filled 2023-12-23: qty 1

## 2023-12-23 MED ORDER — FAMOTIDINE 20 MG PO TABS: 20.0000 mg | ORAL_TABLET | Freq: Two times a day (BID) | ORAL | 0 refills | Status: AC

## 2023-12-23 MED ORDER — HYDROCODONE-ACETAMINOPHEN 5-325 MG PO TABS
2.0000 | ORAL_TABLET | Freq: Once | ORAL | Status: AC
  Administered 2023-12-23: 2 via ORAL
  Filled 2023-12-23: qty 2

## 2023-12-23 MED ORDER — MORPHINE SULFATE (PF) 2 MG/ML IV SOLN
2.0000 mg | Freq: Once | INTRAVENOUS | Status: AC
  Administered 2023-12-23: 2 mg via INTRAVENOUS
  Filled 2023-12-23: qty 1

## 2023-12-23 MED ORDER — IOHEXOL 300 MG/ML  SOLN
100.0000 mL | Freq: Once | INTRAMUSCULAR | Status: AC | PRN
  Administered 2023-12-23: 100 mL via INTRAVENOUS

## 2023-12-23 NOTE — ED Triage Notes (Signed)
 Patient c/o sharp central abd pain that started at 6am today Pain is 10/10 Says it comes in waves

## 2023-12-23 NOTE — ED Provider Notes (Signed)
 [place] EMERGENCY DEPARTMENT AT [facility] Provider Note   CSN: [id number] Arrival date & time: 12/23/23  1340     Patient presents with: Abdominal Pain   Brittney Boyle is a 28 y.o. female.  28 year old female presents ED with complaints of severe periumbilical abdominal pain since 6 AM with insidious onset.  Patient has significant history of cholecystectomy and factor V Leiden.  Patient has associated nausea without vomiting, lightheadedness, and reported diarrhea.  Patient denies chest pain, shortness of breath, visual changes, headache, vaginal discharge, pelvic pain, suprapubic pain.  Patient does not take any daily medications besides over-the-counter vitamins.  Patient reports she has not been able to eat or drink all day.     Prior to Admission medications   Medication Sig Start Date End Date Taking? Authorizing Provider  famotidine  (PEPCID ) 20 MG tablet Take 1 tablet (20 mg total) by mouth 2 (two) times daily. 12/23/23  Yes Keith, Kayla N, PA-C  ASHWAGANDHA PO Take 2 tablets by mouth daily.    [provider]  aspirin-acetaminophen -caffeine (EXCEDRIN MIGRAINE) 250-250-65 MG tablet Take 1 tablet by mouth daily as needed for migraine.    [provider]  azithromycin  (ZITHROMAX ) 250 MG tablet Take 1 tablet (250 mg total) by mouth daily. Take first 2 tablets together, then 1 every day until finished. 03/06/19   Maranda Jamee Jacob, MD  benzonatate  (TESSALON ) 200 MG capsule Take 1 capsule (200 mg total) by mouth 2 (two) times daily as needed for cough. 03/06/19   Maranda Jamee Jacob, MD  cephALEXin  (KEFLEX ) 500 MG capsule Take 1 capsule (500 mg total) by mouth 3 (three) times daily. 08/01/23   Sung, Jade J, MD  dextromethorphan-guaiFENesin  (MUCINEX  DM) 30-600 MG 12hr tablet Take 1 tablet by mouth 2 (two) times daily. 03/06/19   Maranda Jamee Jacob, MD  ibuprofen  (ADVIL ) 600 MG tablet Take 1 tablet (600 mg total) by mouth every 6 (six) hours as needed.  10/18/18   Mesner, Selinda, MD  ketorolac  (TORADOL ) 10 MG tablet Take 1 tablet (10 mg total) by mouth every 6 (six) hours as needed. 06/18/21   Cuthriell, Dorn BIRCH, PA-C  Levonorgestrel-Ethinyl Estradiol (AMETHIA,CAMRESE) 0.1-0.02 & 0.01 MG tablet Take 1 tablet by mouth daily. 08/15/17   [provider]  meloxicam  (MOBIC ) 15 MG tablet Take 1 tablet (15 mg total) by mouth daily. 08/20/20   Geiple, Kirsten Mckone, PA-C  Multiple Vitamin (MULTIVITAMIN WITH MINERALS) TABS tablet Take 1 tablet by mouth daily.    [provider]  ondansetron  (ZOFRAN -ODT) 4 MG disintegrating tablet Take 1 tablet (4 mg total) by mouth every 8 (eight) hours as needed for nausea or vomiting. 08/01/23   Sung, Jade J, MD  oxyCODONE -acetaminophen  (PERCOCET/ROXICET) 5-325 MG tablet Take 1 tablet by mouth every 4 (four) hours as needed for severe pain (pain score 7-10). 08/01/23   Robinette Vermell PARAS, MD  predniSONE  (DELTASONE ) 20 MG tablet Take 1 tablet (20 mg total) by mouth 2 (two) times daily with a meal. 03/06/19   Maranda Jamee Jacob, MD  Probiotic Product ([employer] ADV PROBIOTIC GUMMIES) CHEW Chew 2 each by mouth daily.    [provider]  tamsulosin  (FLOMAX ) 0.4 MG CAPS capsule Take 1 capsule (0.4 mg total) by mouth daily. 06/18/21   Cuthriell, Jonathan D, PA-C  venlafaxine XR (EFFEXOR-XR) 150 MG 24 hr capsule Take 300 mg by mouth daily with breakfast.  03/14/17 10/18/18  [provider]    Allergies: Sulfa antibiotics and Shellfish allergy    Review  of Systems  Gastrointestinal:  Positive for abdominal pain, diarrhea and nausea. Negative for vomiting.  All other systems reviewed and are negative.   Updated Vital Signs BP (!) 129/92   Pulse 85   Temp 98.3 F (36.8 C) (Oral)   Resp 11   Ht 5' 4 (1.626 m)   Wt 113.4 kg   LMP 11/28/2023 (Approximate)   SpO2 99%   BMI 42.91 kg/m   Physical Exam Vitals and nursing note reviewed.  Constitutional:      Appearance: Normal appearance. She is well-developed.   HENT:     Head: Normocephalic and atraumatic.     Nose: Nose normal.  Eyes:     Extraocular Movements: Extraocular movements intact.     Conjunctiva/sclera: Conjunctivae normal.     Pupils: Pupils are equal, round, and reactive to light.  Cardiovascular:     Rate and Rhythm: Normal rate.  Pulmonary:     Effort: Pulmonary effort is normal. No respiratory distress.     Breath sounds: Normal breath sounds.  Abdominal:     General: Abdomen is flat. Bowel sounds are normal. There is no distension or abdominal bruit. There are no signs of injury.     Palpations: Abdomen is soft. There is no pulsatile mass.     Tenderness: There is abdominal tenderness in the periumbilical area. There is no right CVA tenderness or left CVA tenderness. Negative signs include McBurney's sign.     Comments: Rashes or distention noted.  Abdomen is soft with tenderness significant in the periumbilical area and superior to umbilicus.  Musculoskeletal:        General: Normal range of motion.     Cervical back: Normal range of motion.  Skin:    General: Skin is warm.     Capillary Refill: Capillary refill takes less than 2 seconds.  Neurological:     General: No focal deficit present.     Mental Status: She is alert.  Psychiatric:        Mood and Affect: Mood normal.        Behavior: Behavior normal.     (all labs ordered are listed, but only abnormal results are displayed) Labs Reviewed  COMPREHENSIVE METABOLIC PANEL WITH GFR - Abnormal; Notable for the following components:      Result Value   CO2 21 (*)    Alkaline Phosphatase 132 (*)    All other components within normal limits  CBC - Abnormal; Notable for the following components:   WBC 10.9 (*)    MCH 25.7 (*)    All other components within normal limits  URINALYSIS, ROUTINE W REFLEX MICROSCOPIC - Abnormal; Notable for the following components:   APPearance HAZY (*)    Specific Gravity, Urine 1.039 (*)    All other components within normal  limits  D-DIMER, QUANTITATIVE (NOT AT [facility]) - Abnormal; Notable for the following components:   D-Dimer, Quant 2.01 (*)    All other components within normal limits  LIPASE, BLOOD  HCG, SERUM, QUALITATIVE  PROTIME-INR  APTT    EKG: EKG Interpretation Date/Time:  Sunday December 23 2023 17:29:35 EST Ventricular Rate:  91 PR Interval:  151 QRS Duration:  85 QT Interval:  347 QTC Calculation: 427 R Axis:   70  Text Interpretation: Sinus rhythm Confirmed by Neysa Clap [phone]) on 12/24/2023 8:25:03 PM  Radiology: CT Angio Chest PE W and/or Wo Contrast Result Date: 12/23/2023 CLINICAL DATA:  Evaluate for pulmonary embolism. EXAM: CT ANGIOGRAPHY CHEST WITH CONTRAST  TECHNIQUE: Multidetector CT imaging of the chest was performed using the standard protocol during bolus administration of intravenous contrast. Multiplanar CT image reconstructions and MIPs were obtained to evaluate the vascular anatomy. RADIATION DOSE REDUCTION: This exam was performed according to the departmental dose-optimization program which includes automated exposure control, adjustment of the mA and/or kV according to patient size and/or use of iterative reconstruction technique. CONTRAST:  75mL OMNIPAQUE  IOHEXOL  350 MG/ML SOLN COMPARISON:  None Available. FINDINGS: Cardiovascular: The thoracic aorta is normal in appearance. The segmental and subsegmental branches of the bilateral pulmonary arteries are limited in evaluation secondary to suboptimal opacification with intravenous contrast and areas of overlying artifact. No evidence of pulmonary embolism. Normal heart size. No pericardial effusion. Mediastinum/Nodes: No enlarged mediastinal, hilar, or axillary lymph nodes. Thyroid gland, trachea, and esophagus demonstrate no significant findings. Lungs/Pleura: Lungs are clear. No pleural effusion or pneumothorax. Upper Abdomen: Surgical clips are seen within the gallbladder fossa. Musculoskeletal: No chest wall abnormality. No  acute or significant osseous findings. Review of the MIP images confirms the above findings. IMPRESSION: 1. Limited study, as described above, without definite evidence of pulmonary embolism or acute cardiopulmonary disease. 2. Evidence of prior cholecystectomy. Electronically Signed   By: Suzen Dials M.D.   On: 12/23/2023 22:54   CT ABDOMEN PELVIS W CONTRAST Result Date: 12/23/2023 EXAM: CT ABDOMEN AND PELVIS WITH CONTRAST 12/23/2023 06:17:58 PM TECHNIQUE: CT of the abdomen and pelvis was performed with the administration of 100 mL of iohexol  (OMNIPAQUE ) 300 MG/ML solution. Multiplanar reformatted images are provided for review. Automated exposure control, iterative reconstruction, and/or weight-based adjustment of the mA/kV was utilized to reduce the radiation dose to as low as reasonably achievable. COMPARISON: 07/31/2023 CLINICAL HISTORY: Abdominal pain, acute, nonlocalized. FINDINGS: LOWER CHEST: No acute abnormality. LIVER: The liver is unremarkable. GALLBLADDER AND BILE DUCTS: Status post cholecystectomy. No biliary ductal dilatation. SPLEEN: No acute abnormality. PANCREAS: No acute abnormality. ADRENAL GLANDS: No acute abnormality. KIDNEYS, URETERS AND BLADDER: Bilateral nonobstructive nephrolithiasis. No stones in the ureters. No hydronephrosis. No perinephric or periureteral stranding. Urinary bladder is unremarkable. GI AND BOWEL: Stomach demonstrates no acute abnormality. There is no bowel obstruction. PERITONEUM AND RETROPERITONEUM: No ascites. No free air. VASCULATURE: Aorta is normal in caliber. LYMPH NODES: No lymphadenopathy. REPRODUCTIVE ORGANS: No acute abnormality. BONES AND SOFT TISSUES: No acute osseous abnormality. No focal soft tissue abnormality. IMPRESSION: 1. Status post cholecystectomy. 2. Bilateral nonobstructive nephrolithiasis. Electronically signed by: Lynwood Seip MD 12/23/2023 06:37 PM EST RP Workstation: [id number]    Procedures   Medications Ordered in the ED   ondansetron  (ZOFRAN -ODT) disintegrating tablet 4 mg (4 mg Oral Given 12/23/23 1740)  HYDROcodone -acetaminophen  (NORCO/VICODIN) 5-325 MG per tablet 2 tablet (2 tablets Oral Given 12/23/23 1740)  sodium chloride  0.9 % bolus 1,000 mL (0 mLs Intravenous Stopped 12/23/23 2124)  iohexol  (OMNIPAQUE ) 300 MG/ML solution 100 mL (100 mLs Intravenous Contrast Given 12/23/23 1818)  morphine  (PF) 2 MG/ML injection 2 mg (2 mg Intravenous Given 12/23/23 1903)  morphine  (PF) 4 MG/ML injection 4 mg (4 mg Intravenous Given 12/23/23 2224)  ondansetron  (ZOFRAN ) injection 4 mg (4 mg Intravenous Given 12/23/23 2224)  iohexol  (OMNIPAQUE ) 350 MG/ML injection 75 mL (75 mLs Intravenous Contrast Given 12/23/23 2239)   28 y.o. female presents to the ED with complaints of abdominal pain with associated nausea and diarrhea, this involves an extensive number of treatment options, and is a complaint that carries with it a high risk of complications and morbidity.  The differential diagnosis includes appendicitis, colitis,  pyelonephritis, UTI, nephrolithiasis, gastritis, (Ddx)  On arrival pt is nontoxic, vitals unremarkable. Exam significant for periumbilical pain  I ordered medication hydrocodone  Zofran , morphine  for pain and nausea  Lab Tests:  I Ordered, reviewed, and interpreted labs, which included: CMP, CBC, UA, hCG qualitative, lipase, PT/INR, APTT, D-dimer CMP was unremarkable, CBC mildly elevated white count with no significance, UA unremarkable  Imaging Studies ordered:  I ordered imaging studies which included CT abdomen pelvis with contrast, I independently visualized and interpreted imaging which showed no acute abnormalities noted, evidence of nephrolithiasis nonobstructing which patient was already aware of.  ED Course:   Patient is reporting pain in the periumbilical area.  Patient advised pain was controlled with pain medication.  She has not has any more nausea.  Patient was advised of findings of CT abdomen  pelvis.   Patient has factor V Leiden with history of smoking and is hormonal IUD.  Patient reports she has never had any blood clots or pulmonary embolism.  D-dimer will be ordered for further evaluation with coagulation study.  At shift change patient care was transferred to [facility] pending coag studies and D-dimer.    Portions of this note were generated with Scientist, clinical (histocompatibility and immunogenetics). Dictation errors may occur despite best attempts at proofreading.    Final diagnoses:  Epigastric pain    ED Discharge Orders          Ordered    famotidine  (PEPCID ) 20 MG tablet  2 times daily        12/23/23 2315               Myriam Fonda GORMAN DEVONNA 12/24/23 2311    Yolande Lamar BROCKS, MD 12/26/23 [phone]

## 2023-12-23 NOTE — Discharge Instructions (Addendum)
 Today you were seen for abdominal pain.  Workup while in the emergency department was reassuring.  Please pick up your Pepcid  and take as prescribed.  Please follow-up with [facility] gastroenterology if your symptoms persist for further evaluation and workup.  Thank you for letting us  treat you today. After reviewing your labs and imaging, I feel you are safe to go home. Please follow up with your PCP in the next several days and provide them with your records from this visit. Return to the Emergency Room if pain becomes severe or symptoms worsen.

## 2023-12-23 NOTE — ED Provider Notes (Signed)
 Patient signed out to myself by Fonda Siva, PA-C pending labs which will determine patient disposition.  In short patient with a past medical history significant for factor V Leiden presenting today with periumbilical pain since 6 AM.  Patient reports associated nausea, lightheadedness, and diarrhea.  Patient denies chest pain, shortness of breath, vision changes, headache, or any other complaints at this time.  D-dimer was ordered given history of factor V Leiden with history of smoking and hormonal IUD use.  D-dimer positive at 2.01 Pro time and INR WNL APTT WNL  CT angio PE which showed limited study, no definitive evidence of PE   Patient given outpatient course of Pepcid .  Considered for admission or further evaluation however patient's vital signs, physical exam, labs, and imaging are reassuring.  Patient advised to follow-up with [facility] gastroenterology for further evaluation workup.  Patient given return precautions.  I feel patient safe for discharge at this time.     Francis Ileana SAILOR, PA-C 12/23/23 2315    Ula Prentice SAUNDERS, MD 12/23/23 [phone]

## 2023-12-25 ENCOUNTER — Encounter (HOSPITAL_BASED_OUTPATIENT_CLINIC_OR_DEPARTMENT_OTHER): Payer: Self-pay | Admitting: Emergency Medicine

## 2023-12-25 ENCOUNTER — Emergency Department (HOSPITAL_BASED_OUTPATIENT_CLINIC_OR_DEPARTMENT_OTHER)

## 2023-12-25 ENCOUNTER — Emergency Department (HOSPITAL_BASED_OUTPATIENT_CLINIC_OR_DEPARTMENT_OTHER)
Admission: EM | Admit: 2023-12-25 | Discharge: 2023-12-25 | Disposition: A | Discharge status: Home / Self Care | Attending: Emergency Medicine | Admitting: Emergency Medicine

## 2023-12-25 ENCOUNTER — Other Ambulatory Visit: Payer: Self-pay

## 2023-12-25 DIAGNOSIS — R1033 Periumbilical pain: Secondary | ICD-10-CM | POA: Diagnosis not present

## 2023-12-25 DIAGNOSIS — R1013 Epigastric pain: Secondary | ICD-10-CM | POA: Insufficient documentation

## 2023-12-25 DIAGNOSIS — Z7982 Long term (current) use of aspirin: Secondary | ICD-10-CM | POA: Diagnosis not present

## 2023-12-25 DIAGNOSIS — R748 Abnormal levels of other serum enzymes: Secondary | ICD-10-CM | POA: Insufficient documentation

## 2023-12-25 DIAGNOSIS — R1011 Right upper quadrant pain: Secondary | ICD-10-CM | POA: Diagnosis not present

## 2023-12-25 DIAGNOSIS — R300 Dysuria: Secondary | ICD-10-CM | POA: Diagnosis not present

## 2023-12-25 DIAGNOSIS — R112 Nausea with vomiting, unspecified: Secondary | ICD-10-CM | POA: Insufficient documentation

## 2023-12-25 LAB — URINALYSIS, ROUTINE W REFLEX MICROSCOPIC
Bilirubin Urine: NEGATIVE
Glucose, UA: NEGATIVE mg/dL
Ketones, ur: NEGATIVE mg/dL
Nitrite: NEGATIVE
Protein, ur: NEGATIVE mg/dL
Specific Gravity, Urine: 1.025 (ref 1.005–1.030)
pH: 7 (ref 5.0–8.0)

## 2023-12-25 LAB — COMPREHENSIVE METABOLIC PANEL WITH GFR
ALT: 21 U/L (ref 0–44)
AST: 29 U/L (ref 15–41)
Albumin: 4.3 g/dL (ref 3.5–5.0)
Alkaline Phosphatase: 104 U/L (ref 38–126)
Anion gap: 13 (ref 5–15)
BUN: 8 mg/dL (ref 6–20)
CO2: 21 mmol/L — ABNORMAL LOW (ref 22–32)
Calcium: 9.2 mg/dL (ref 8.9–10.3)
Chloride: 105 mmol/L (ref 98–111)
Creatinine, Ser: 0.75 mg/dL (ref 0.44–1.00)
GFR, Estimated: >60 mL/min (ref >60)
Glucose, Bld: 88 mg/dL (ref 70–99)
Potassium: 4.1 mmol/L (ref 3.5–5.1)
Sodium: 139 mmol/L (ref 135–145)
Total Bilirubin: 0.3 mg/dL (ref 0.0–1.2)
Total Protein: 7.3 g/dL (ref 6.5–8.1)

## 2023-12-25 LAB — PREGNANCY, URINE: Preg Test, Ur: NEGATIVE

## 2023-12-25 LAB — URINALYSIS, MICROSCOPIC (REFLEX)

## 2023-12-25 LAB — CBC
HCT: 38.8 % (ref 36.0–46.0)
Hemoglobin: 12.5 g/dL (ref 12.0–15.0)
MCH: 26.5 pg (ref 26.0–34.0)
MCHC: 32.2 g/dL (ref 30.0–36.0)
MCV: 82.4 fL (ref 80.0–100.0)
Platelets: 293 K/uL (ref 150–400)
RBC: 4.71 MIL/uL (ref 3.87–5.11)
RDW: 14 % (ref 11.5–15.5)
WBC: 4.7 K/uL (ref 4.0–10.5)
nRBC: 0 % (ref 0.0–0.2)

## 2023-12-25 LAB — LIPASE, BLOOD: Lipase: 84 U/L — ABNORMAL HIGH (ref 11–51)

## 2023-12-25 MED ORDER — ALUM & MAG HYDROXIDE-SIMETH 200-200-20 MG/5ML PO SUSP
30.0000 mL | Freq: Once | ORAL | Status: AC
  Administered 2023-12-25: 30 mL via ORAL
  Filled 2023-12-25: qty 30

## 2023-12-25 MED ORDER — ONDANSETRON 4 MG PO TBDP: 4.0000 mg | ORAL_TABLET | Freq: Three times a day (TID) | ORAL | 0 refills | Status: AC | PRN

## 2023-12-25 MED ORDER — ONDANSETRON HCL 4 MG/2ML IJ SOLN
4.0000 mg | Freq: Once | INTRAMUSCULAR | Status: AC
  Administered 2023-12-25: 4 mg via INTRAVENOUS
  Filled 2023-12-25: qty 2

## 2023-12-25 MED ORDER — HYDROMORPHONE HCL 1 MG/ML IJ SOLN
1.0000 mg | Freq: Once | INTRAMUSCULAR | Status: AC
  Administered 2023-12-25: 1 mg via INTRAVENOUS
  Filled 2023-12-25: qty 1

## 2023-12-25 MED ORDER — SUCRALFATE 1 G PO TABS: 1.0000 g | ORAL_TABLET | Freq: Three times a day (TID) | ORAL | 0 refills | Status: AC

## 2023-12-25 MED ORDER — DICYCLOMINE HCL 20 MG PO TABS: 20.0000 mg | ORAL_TABLET | Freq: Two times a day (BID) | ORAL | 0 refills | Status: AC

## 2023-12-25 MED ORDER — SODIUM CHLORIDE 0.9 % IV BOLUS
1000.0000 mL | Freq: Once | INTRAVENOUS | Status: AC
  Administered 2023-12-25: 1000 mL via INTRAVENOUS

## 2023-12-25 MED ORDER — LIDOCAINE VISCOUS HCL 2 % MT SOLN
15.0000 mL | Freq: Once | OROMUCOSAL | Status: AC
  Administered 2023-12-25: 15 mL via ORAL
  Filled 2023-12-25: qty 15

## 2023-12-25 MED ORDER — MORPHINE SULFATE (PF) 4 MG/ML IV SOLN
4.0000 mg | Freq: Once | INTRAVENOUS | Status: AC
  Administered 2023-12-25: 4 mg via INTRAVENOUS
  Filled 2023-12-25: qty 1

## 2023-12-25 MED ORDER — PANTOPRAZOLE SODIUM 40 MG IV SOLR
40.0000 mg | Freq: Once | INTRAVENOUS | Status: AC
Start: 2023-12-25 — End: 2023-12-25
  Administered 2023-12-25: 40 mg via INTRAVENOUS
  Filled 2023-12-25: qty 10

## 2023-12-25 MED ORDER — IOHEXOL 300 MG/ML  SOLN
100.0000 mL | Freq: Once | INTRAMUSCULAR | Status: AC | PRN
  Administered 2023-12-25: 100 mL via INTRAVENOUS

## 2023-12-25 MED ORDER — OXYCODONE-ACETAMINOPHEN 5-325 MG PO TABS: 1.0000 | ORAL_TABLET | Freq: Four times a day (QID) | ORAL | 0 refills | Status: AC | PRN

## 2023-12-25 NOTE — Discharge Instructions (Signed)
 It was a pleasure taking care of you here today  Continue taking the Protonix.  As discussed you could have a peptic ulcer or he could have an early pancreatitis.  We typically treat early pancreatitis with a clear liquid diet over the first 24 hours, gradually increase to bland diet afterwards.  I have written you for a few medications to help with your symptoms.  Zofran -this is a nausea medication.  Take as needed for nausea or vomiting Bentyl -this is a nonnarcotic abdominal pain spasm medication.  Take as needed Carafate-this is a medication that will coat your stomach take 4 times daily as prescribed Percocet-this is an opiate pain medication.  Does have addictive potential.  Only use for breakthrough pain.  Do not drive or operate heavy machinery while taking this medication.  Do not take any additional ibuprofen  containing products or nonsteroidal anti-inflammatories as this can worsen your epigastric pain.  Make sure to follow-up outpatient, return for new or worsening symptoms.

## 2023-12-25 NOTE — ED Notes (Signed)
 Pt requesting pain and nausea medications. Provider notified. Pending new orders.

## 2023-12-25 NOTE — ED Notes (Signed)
 Pt transferred from [facility] to ED RM 12. Assuming pt care at this time.

## 2023-12-25 NOTE — ED Notes (Signed)
 Called lab to run urine culture off of urine sample sent to lab previously. Per lab, they will put this into process now.

## 2023-12-25 NOTE — ED Provider Notes (Signed)
 [facility] Provider Note   CSN: [id number] Arrival date & time: 12/25/23  1135    Patient presents with: Abdominal Pain and Dysuria   Brittney Boyle is a 28 y.o. female here for evaluation epigastric abdominal pain and nausea.  She has a history of factor V Leiden.  Was seen for abdominal pain 2 days ago.  Had elevated D-dimer and had CTA chest as well as CT abdomen pelvis which did not show any significant abnormality.  Patient denies any chest pain, shortness of breath, pain or swelling to legs.  She states she has epigastric abdominal pain which radiates into her back.  Pain worse with eating or drinking.  She denies any chronic NSAID use, chronic EtOH use.  She is a social drinker however states does not drink very often.  No bloody stool.  No fever.  She has had some mild dysuria without flank pain.  Does not see GI.  She has never had pancreatitis previously.  She states she was told she might have an ulcer and was discharged home on PPI.  Patient comes today she is still having persistent pain.  Her pain feels like when she had her gallbladder previously however she has had a prior cholecystectomy.  She is on birth control, no other medications.  She has a history of kidney stones however does not feel similar.  Does have a history of GERD.   HPI     Prior to Admission medications   Medication Sig Start Date End Date Taking? Authorizing Provider  dicyclomine  (BENTYL ) 20 MG tablet Take 1 tablet (20 mg total) by mouth 2 (two) times daily. 12/25/23  Yes Jassmin Kemmerer A, PA-C  ondansetron  (ZOFRAN -ODT) 4 MG disintegrating tablet Take 1 tablet (4 mg total) by mouth every 8 (eight) hours as needed. 12/25/23  Yes Lashay Osborne A, PA-C  oxyCODONE -acetaminophen  (PERCOCET/ROXICET) 5-325 MG tablet Take 1 tablet by mouth every 6 (six) hours as needed for severe pain (pain score 7-10). 12/25/23  Yes Hutson Luft A, PA-C  sucralfate (CARAFATE) 1  g tablet Take 1 tablet (1 g total) by mouth 4 (four) times daily -  with meals and at bedtime for 14 days. 12/25/23 01/08/24 Yes Iseah Plouff A, PA-C  ASHWAGANDHA PO Take 2 tablets by mouth daily.    [provider]  aspirin-acetaminophen -caffeine (EXCEDRIN MIGRAINE) 250-250-65 MG tablet Take 1 tablet by mouth daily as needed for migraine.    [provider]  azithromycin  (ZITHROMAX ) 250 MG tablet Take 1 tablet (250 mg total) by mouth daily. Take first 2 tablets together, then 1 every day until finished. 03/06/19   Maranda Jamee Jacob, MD  benzonatate  (TESSALON ) 200 MG capsule Take 1 capsule (200 mg total) by mouth 2 (two) times daily as needed for cough. 03/06/19   Maranda Jamee Jacob, MD  cephALEXin  (KEFLEX ) 500 MG capsule Take 1 capsule (500 mg total) by mouth 3 (three) times daily. 08/01/23   Sung, Jade J, MD  dextromethorphan-guaiFENesin  (MUCINEX  DM) 30-600 MG 12hr tablet Take 1 tablet by mouth 2 (two) times daily. 03/06/19   Maranda Jamee Jacob, MD  famotidine  (PEPCID ) 20 MG tablet Take 1 tablet (20 mg total) by mouth 2 (two) times daily. 12/23/23   Keith, Kayla N, PA-C  ibuprofen  (ADVIL ) 600 MG tablet Take 1 tablet (600 mg total) by mouth every 6 (six) hours as needed. 10/18/18   Mesner, Selinda, MD  ketorolac  (TORADOL ) 10 MG tablet Take 1 tablet (10 mg total)  by mouth every 6 (six) hours as needed. 06/18/21   Cuthriell, Dorn BIRCH, PA-C  Levonorgestrel-Ethinyl Estradiol (AMETHIA,CAMRESE) 0.1-0.02 & 0.01 MG tablet Take 1 tablet by mouth daily. 08/15/17   [provider]  meloxicam  (MOBIC ) 15 MG tablet Take 1 tablet (15 mg total) by mouth daily. 08/20/20   Geiple, Joshua, PA-C  Multiple Vitamin (MULTIVITAMIN WITH MINERALS) TABS tablet Take 1 tablet by mouth daily.    [provider]  predniSONE  (DELTASONE ) 20 MG tablet Take 1 tablet (20 mg total) by mouth 2 (two) times daily with a meal. 03/06/19   Maranda Jamee Jacob, MD  Probiotic Product ([employer] ADV PROBIOTIC GUMMIES) CHEW  Chew 2 each by mouth daily.    [provider]  tamsulosin  (FLOMAX ) 0.4 MG CAPS capsule Take 1 capsule (0.4 mg total) by mouth daily. 06/18/21   Cuthriell, Jonathan D, PA-C  venlafaxine XR (EFFEXOR-XR) 150 MG 24 hr capsule Take 300 mg by mouth daily with breakfast.  03/14/17 10/18/18  [provider]    Allergies: Sulfa antibiotics and Shellfish allergy    Review of Systems  Constitutional: Negative.   HENT: Negative.    Respiratory: Negative.    Cardiovascular: Negative.   Gastrointestinal:  Positive for abdominal pain, nausea and vomiting. Negative for abdominal distention, anal bleeding, blood in stool, constipation, diarrhea and rectal pain.  Genitourinary:  Positive for dysuria. Negative for difficulty urinating, flank pain, frequency, hematuria, pelvic pain and urgency.  Musculoskeletal: Negative.   Skin: Negative.   Neurological: Negative.   All other systems reviewed and are negative.   Updated Vital Signs BP 119/75 (BP Location: Right Arm)   Pulse 70   Temp 98.2 F (36.8 C) (Oral)   Resp 16   Ht 5' 4 (1.626 m)   Wt 113.4 kg   LMP 11/28/2023 (Approximate)   SpO2 100%   BMI 42.91 kg/m   Physical Exam Vitals and nursing note reviewed.  Constitutional:      General: She is not in acute distress.    Appearance: She is well-developed. She is not ill-appearing, toxic-appearing or diaphoretic.  HENT:     Head: Atraumatic.     Mouth/Throat:     Mouth: Mucous membranes are moist.  Eyes:     Pupils: Pupils are equal, round, and reactive to light.  Cardiovascular:     Rate and Rhythm: Normal rate.     Heart sounds: Normal heart sounds.  Pulmonary:     Effort: Pulmonary effort is normal. No respiratory distress.     Breath sounds: Normal breath sounds and air entry.     Comments: Clear bilaterally, speaks in full sentences without difficulty Chest:     Comments: Nontender chest wall Abdominal:     General: Bowel sounds are normal. There is no  distension.     Palpations: Abdomen is soft.     Tenderness: There is abdominal tenderness in the right upper quadrant, epigastric area and periumbilical area. Negative signs include Murphy's sign and McBurney's sign.     Hernia: No hernia is present.     Comments: Diffuse tenderness epigastric and right upper quadrant without rebound  Musculoskeletal:        General: Normal range of motion.     Cervical back: Normal range of motion.     Comments: No bony tenderness, compartments soft.  No lower extremity edema  Skin:    General: Skin is warm and dry.     Capillary Refill: Capillary refill takes less than 2 seconds.  Comments: No obvious rashes or lesions on exposed skin  Neurological:     General: No focal deficit present.     Mental Status: She is alert.  Psychiatric:        Mood and Affect: Mood normal.     (all labs ordered are listed, but only abnormal results are displayed) Labs Reviewed  LIPASE, BLOOD - Abnormal; Notable for the following components:      Result Value   Lipase 84 (*)    All other components within normal limits  COMPREHENSIVE METABOLIC PANEL WITH GFR - Abnormal; Notable for the following components:   CO2 21 (*)    All other components within normal limits  URINALYSIS, ROUTINE W REFLEX MICROSCOPIC - Abnormal; Notable for the following components:   Hgb urine dipstick TRACE (*)    Leukocytes,Ua SMALL (*)    All other components within normal limits  URINALYSIS, MICROSCOPIC (REFLEX) - Abnormal; Notable for the following components:   Bacteria, UA FEW (*)    All other components within normal limits  URINE CULTURE  CBC  PREGNANCY, URINE    EKG: None  Radiology: CT ABDOMEN PELVIS W CONTRAST Result Date: 12/25/2023 CLINICAL DATA:  Epigastric pain chills, worsening epigastric abd pain, elevated lipase EXAM: CT ABDOMEN AND PELVIS WITH CONTRAST TECHNIQUE: Multidetector CT imaging of the abdomen and pelvis was performed using the standard protocol  following bolus administration of intravenous contrast. RADIATION DOSE REDUCTION: This exam was performed according to the departmental dose-optimization program which includes automated exposure control, adjustment of the mA and/or kV according to patient size and/or use of iterative reconstruction technique. CONTRAST:  OMNIPAQUE  IOHEXOL  300 MG/ML  SOLN COMPARISON:  December 23, 2023 FINDINGS: Lower chest: No focal airspace consolidation or pleural effusion. Hepatobiliary: No mass.Cholecystectomy. No intrahepatic or extrahepatic biliary ductal dilation. The portal veins are patent. Pancreas: No mass or main ductal dilation. No peripancreatic inflammation or fluid collection. Spleen: Normal size. No mass. Adrenals/Urinary Tract: No adrenal masses. Subcentimeter hypodensity noted in the right kidney, too small to definitively characterize, but likely a small cyst. Multiple small nonobstructive bilateral calyceal calculi. No hydronephrosis. The urinary bladder is distended without focal abnormality. Stomach/Bowel: The stomach is decompressed without focal abnormality. No small bowel wall thickening or inflammation. No small bowel obstruction.Normal appendix. Vascular/Lymphatic: No aortic aneurysm. No intraabdominal or pelvic lymphadenopathy. Reproductive: The uterus and ovaries are within normal limits for patient's age.Trace free fluid in the pelvis, likely physiologic in a female of this age. Other: No pneumoperitoneum, ascites, or mesenteric inflammation. Musculoskeletal: No acute fracture or destructive lesion. IMPRESSION: 1. No acute intra-abdominal or pelvic abnormality. 2. Nonobstructive bilateral nephrolithiasis.  No hydronephrosis. Electronically Signed   By: Rogelia Myers M.D.   On: 12/25/2023 16:07   CT Angio Chest PE W and/or Wo Contrast Result Date: 12/23/2023 CLINICAL DATA:  Evaluate for pulmonary embolism. EXAM: CT ANGIOGRAPHY CHEST WITH CONTRAST TECHNIQUE: Multidetector CT imaging of the  chest was performed using the standard protocol during bolus administration of intravenous contrast. Multiplanar CT image reconstructions and MIPs were obtained to evaluate the vascular anatomy. RADIATION DOSE REDUCTION: This exam was performed according to the departmental dose-optimization program which includes automated exposure control, adjustment of the mA and/or kV according to patient size and/or use of iterative reconstruction technique. CONTRAST:  75mL OMNIPAQUE  IOHEXOL  350 MG/ML SOLN COMPARISON:  None Available. FINDINGS: Cardiovascular: The thoracic aorta is normal in appearance. The segmental and subsegmental branches of the bilateral pulmonary arteries are limited in evaluation secondary to suboptimal  opacification with intravenous contrast and areas of overlying artifact. No evidence of pulmonary embolism. Normal heart size. No pericardial effusion. Mediastinum/Nodes: No enlarged mediastinal, hilar, or axillary lymph nodes. Thyroid gland, trachea, and esophagus demonstrate no significant findings. Lungs/Pleura: Lungs are clear. No pleural effusion or pneumothorax. Upper Abdomen: Surgical clips are seen within the gallbladder fossa. Musculoskeletal: No chest wall abnormality. No acute or significant osseous findings. Review of the MIP images confirms the above findings. IMPRESSION: 1. Limited study, as described above, without definite evidence of pulmonary embolism or acute cardiopulmonary disease. 2. Evidence of prior cholecystectomy. Electronically Signed   By: Suzen Dials M.D.   On: 12/23/2023 22:54   CT ABDOMEN PELVIS W CONTRAST Result Date: 12/23/2023 EXAM: CT ABDOMEN AND PELVIS WITH CONTRAST 12/23/2023 06:17:58 PM TECHNIQUE: CT of the abdomen and pelvis was performed with the administration of 100 mL of iohexol  (OMNIPAQUE ) 300 MG/ML solution. Multiplanar reformatted images are provided for review. Automated exposure control, iterative reconstruction, and/or weight-based adjustment of  the mA/kV was utilized to reduce the radiation dose to as low as reasonably achievable. COMPARISON: 07/31/2023 CLINICAL HISTORY: Abdominal pain, acute, nonlocalized. FINDINGS: LOWER CHEST: No acute abnormality. LIVER: The liver is unremarkable. GALLBLADDER AND BILE DUCTS: Status post cholecystectomy. No biliary ductal dilatation. SPLEEN: No acute abnormality. PANCREAS: No acute abnormality. ADRENAL GLANDS: No acute abnormality. KIDNEYS, URETERS AND BLADDER: Bilateral nonobstructive nephrolithiasis. No stones in the ureters. No hydronephrosis. No perinephric or periureteral stranding. Urinary bladder is unremarkable. GI AND BOWEL: Stomach demonstrates no acute abnormality. There is no bowel obstruction. PERITONEUM AND RETROPERITONEUM: No ascites. No free air. VASCULATURE: Aorta is normal in caliber. LYMPH NODES: No lymphadenopathy. REPRODUCTIVE ORGANS: No acute abnormality. BONES AND SOFT TISSUES: No acute osseous abnormality. No focal soft tissue abnormality. IMPRESSION: 1. Status post cholecystectomy. 2. Bilateral nonobstructive nephrolithiasis. Electronically signed by: Lynwood Seip MD 12/23/2023 06:37 PM EST RP Workstation: [id number]     Procedures   Medications Ordered in the ED  morphine  (PF) 4 MG/ML injection 4 mg (4 mg Intravenous Given 12/25/23 1459)  ondansetron  (ZOFRAN ) injection 4 mg (4 mg Intravenous Given 12/25/23 1459)  sodium chloride  0.9 % bolus 1,000 mL ( Intravenous Stopped 12/25/23 1621)  pantoprazole (PROTONIX) injection 40 mg (40 mg Intravenous Given 12/25/23 1459)  alum & mag hydroxide-simeth (MAALOX/MYLANTA) 200-200-20 MG/5ML suspension 30 mL (30 mLs Oral Given 12/25/23 1459)    And  lidocaine  (XYLOCAINE ) 2 % viscous mouth solution 15 mL (15 mLs Oral Given 12/25/23 1459)  iohexol  (OMNIPAQUE ) 300 MG/ML solution 100 mL (100 mLs Intravenous Contrast Given 12/25/23 1507)  HYDROmorphone  (DILAUDID ) injection 1 mg (1 mg Intravenous Given 12/25/23 2341)   28 year old here for evaluation of  persistent epigastric abdominal pain and nausea.  Radiates to back.  Initially seen at outside hospital had CTA chest as well as CT abdomen pelvis without acute abnormality.  States pain has been persistent.  She is s/p lap chole in 2019.  She denies any bloody stool.  No chronic NSAID use, chronic EtOH use, GLP-1 use.  I reviewed her imaging and labs from a few days ago.  Had normal lipase then.  Shared decision making for patient given she had CT imaging.  She feels like her pain has worsened.  Will proceed with additional labs, imaging at this time.  Labs and imaging personally viewed and interpreted:  CBC without leukocytosis Metabolic panel without significant abnormality Lipase 84 UA small leuks, few bacteria--sent for culture Pregnancy test negative CT AP without acute abnormality pancreas  Patient reassessed.  Pain controlled.  Discussed labs and imaging.  Tolerating p.o. intake.  Will treat as early pancreatitis.  Discussed critical.  Will also add on Carafate to cover for PUD.  Will have her follow-up with GI.  She will return for new or worsening symptoms  Patient is nontoxic, nonseptic appearing, in no apparent distress.  Patient's pain and other symptoms adequately managed in emergency department.  Fluid bolus given.  Labs, imaging and vitals reviewed.  Patient does not meet the SIRS or Sepsis criteria.  On repeat exam patient does not have a surgical abdomin and there are no peritoneal signs.  No indication of appendicitis, bowel obstruction, bowel perforation, cholecystitis, diverticulitis, PID, intermittent, persistent torsion, TOA, ectopic pregnancy, AAA, dissection, traumatic injury, pneumonia, pneumothorax, PE, ACS, Boerhaave rupture, GI bleed.  Patient discharged home with symptomatic treatment and given strict instructions for follow-up with their primary care physician.  I have also discussed reasons to return immediately to the ER.  Patient expresses understanding and agrees with  plan.                                   Medical Decision Making Amount and/or Complexity of Data Reviewed Independent Historian: friend External Data Reviewed: labs, radiology and notes. Labs: ordered. Decision-making details documented in ED Course. Radiology: ordered and independent interpretation performed. Decision-making details documented in ED Course.  Risk OTC drugs. Prescription drug management. Parenteral controlled substances. Decision regarding hospitalization. Diagnosis or treatment significantly limited by social determinants of health.        Final diagnoses:  Epigastric pain  Elevated lipase    ED Discharge Orders          Ordered    sucralfate (CARAFATE) 1 g tablet  3 times daily with meals & bedtime        12/25/23 1703    dicyclomine  (BENTYL ) 20 MG tablet  2 times daily        12/25/23 1703    oxyCODONE -acetaminophen  (PERCOCET/ROXICET) 5-325 MG tablet  Every 6 hours PRN        12/25/23 1703    ondansetron  (ZOFRAN -ODT) 4 MG disintegrating tablet  Every 8 hours PRN        12/25/23 1703               Rhylan Gross A, PA-C 12/25/23 1741    Long, Joshua G, MD 12/26/23 1100

## 2023-12-25 NOTE — ED Triage Notes (Signed)
 Pt c/o epigastric abd pain, radiating to back x 2 days.  Dysuria x 1 day. Denies known fever.   +n/d. Denies known sick contacts.

## 2023-12-25 NOTE — ED Notes (Signed)
 Pt transported to CT at this time.

## 2023-12-26 LAB — URINE CULTURE
